# Patient Record
Sex: Male | Born: 1970
Health system: Southern US, Community
[De-identification: ages and names within clinical notes are randomized; demographics above are authoritative.]

## PROBLEM LIST (undated history)

## (undated) DIAGNOSIS — I10 Essential (primary) hypertension: Secondary | ICD-10-CM

## (undated) HISTORY — PX: VASECTOMY: SHX75

## (undated) HISTORY — PX: HERNIA REPAIR: SHX51

---

## 2012-09-19 ENCOUNTER — Encounter: Payer: Self-pay | Admitting: *Deleted

## 2012-09-19 ENCOUNTER — Emergency Department (INDEPENDENT_AMBULATORY_CARE_PROVIDER_SITE_OTHER)
Admission: EM | Admit: 2012-09-19 | Discharge: 2012-09-19 | Disposition: A | Discharge status: Home / Self Care | Payer: BC Managed Care – PPO | Source: Home / Self Care | Attending: Emergency Medicine | Admitting: Emergency Medicine

## 2012-09-19 ENCOUNTER — Emergency Department (INDEPENDENT_AMBULATORY_CARE_PROVIDER_SITE_OTHER): Payer: BC Managed Care – PPO

## 2012-09-19 DIAGNOSIS — X58XXXA Exposure to other specified factors, initial encounter: Secondary | ICD-10-CM

## 2012-09-19 DIAGNOSIS — S46911A Strain of unspecified muscle, fascia and tendon at shoulder and upper arm level, right arm, initial encounter: Secondary | ICD-10-CM

## 2012-09-19 DIAGNOSIS — M75101 Unspecified rotator cuff tear or rupture of right shoulder, not specified as traumatic: Secondary | ICD-10-CM

## 2012-09-19 DIAGNOSIS — S4980XA Other specified injuries of shoulder and upper arm, unspecified arm, initial encounter: Secondary | ICD-10-CM

## 2012-09-19 DIAGNOSIS — S43429A Sprain of unspecified rotator cuff capsule, initial encounter: Secondary | ICD-10-CM

## 2012-09-19 DIAGNOSIS — I10 Essential (primary) hypertension: Secondary | ICD-10-CM

## 2012-09-19 DIAGNOSIS — S46011A Strain of muscle(s) and tendon(s) of the rotator cuff of right shoulder, initial encounter: Secondary | ICD-10-CM

## 2012-09-19 DIAGNOSIS — M25519 Pain in unspecified shoulder: Secondary | ICD-10-CM

## 2012-09-19 HISTORY — DX: Essential (primary) hypertension: I10

## 2012-09-19 MED ORDER — RAMIPRIL 10 MG PO CAPS: 10.0000 mg | ORAL_CAPSULE | Freq: Every day | ORAL | Status: DC

## 2012-09-19 MED ORDER — HYDROCODONE-ACETAMINOPHEN 5-500 MG PO TABS: ORAL_TABLET | ORAL | Status: DC

## 2012-09-19 NOTE — ED Provider Notes (Signed)
History     CSN: [id number]  Arrival date & time 09/19/12  1519   First MD Initiated Contact with Patient 09/19/12 1531      Chief Complaint  Patient presents with  . Shoulder Injury    right     Patient is a 42 y.o. male presenting with shoulder injury. The history is provided by the patient.  Shoulder Injury This is a new problem. Episode onset: 5 days ago, on 09/14/12. The problem occurs constantly. The problem has been gradually worsening. Pertinent negatives include no chest pain, no abdominal pain, no headaches and no shortness of breath. Exacerbated by: Moving right arm and shoulder. The symptoms are relieved by rest. Treatments tried: Ibuprofen. The treatment provided mild relief.   5 days ago, he was drilling through a steel column, holding a large drill with right hand and applying the drill well holding the column with his left arm and hand.-He immediately developed dull pain in the right shoulder, and that has progressed daily to sharp, severe pain right shoulder especially posterior right shoulder, exacerbated by all movement of the shoulder. Denies radiation or paresthesias or focal weakness. No chest pain or dyspnea. He feels he has limited range of motion.  He states he was diagnosed with hypertension years ago, treated with ramipril which controlled his BP. He states his PCP retired in the past and patient has not taken the route and are probably for over 1 year. He's not had his BP checked since. He denies chest pain or dyspnea or any focal neurologic symptoms. He would like to establish with a new PCP, and I gave him names and numbers of the PCPs and he'll schedule an appointment within the next week for followup of his BP.  Past Medical History  Diagnosis Date  . Hypertension     untreated    Past Surgical History  Procedure Date  . Vasectomy     History reviewed. No pertinent family history.  History  Substance Use Topics  . Smoking status: Never Smoker   .  Smokeless tobacco: Not on file  . Alcohol Use: No      Review of Systems  HENT: Negative.   Respiratory: Negative for cough and shortness of breath.   Cardiovascular: Negative for chest pain, palpitations and leg swelling.  Gastrointestinal: Negative.  Negative for abdominal pain.  Genitourinary: Negative.   Neurological: Negative.  Negative for numbness and headaches.  All other systems reviewed and are negative.    Allergies  Review of patient's allergies indicates no known allergies.  Home Medications   Current Outpatient Rx  Name  Route  Sig  Dispense  Refill  . HYDROCODONE-ACETAMINOPHEN 5-500 MG PO TABS      Take 1 or 2 every 4-6 hours as needed for severe pain   15 tablet   0   . RAMIPRIL 10 MG PO CAPS   Oral   Take 1 capsule (10 mg total) by mouth daily. For BP. Take Every day.   30 capsule   0     BP 196/137  Pulse 64  Resp 14  Ht 5' 10.5" (1.791 m)  Wt 267 lb (121.11 kg)  BMI 37.77 kg/m2  SpO2 97%  Physical Exam  Nursing note and vitals reviewed. Constitutional: He is oriented to person, place, and time. He appears well-developed and well-nourished. No distress.       He appears very uncomfortable from right shoulder pain, splinting his right shoulder. No acute cardiorespiratory distress  HENT:  Head: Normocephalic and atraumatic.  Eyes: Conjunctivae normal and EOM are normal. Pupils are equal, round, and reactive to light. No scleral icterus.  Neck: Normal range of motion. Neck supple. No JVD present. No spinous process tenderness present.  Cardiovascular: Normal rate, regular rhythm, S1 normal and S2 normal.  Exam reveals no gallop.   No murmur heard. Pulmonary/Chest: Effort normal and breath sounds normal.  Abdominal: He exhibits no distension. There is no tenderness.  Musculoskeletal:       Right shoulder: He exhibits decreased range of motion (Markedly decreased range of motion, passive abduction to 90. With pain. Active range of motion  markedly limited to 45 with pain), tenderness (Exquisitely tender to palpation over the rotator cuff area of posterior shoulder.) and bony tenderness. He exhibits no swelling, no laceration, no spasm and normal pulse.       Left shoulder: Normal.       Right elbow: Normal.      Right wrist: Normal.       R shoulder :The rotator cuff, empty can impingement sign is markedly positive.  Neurological: He is alert and oriented to person, place, and time. No sensory deficit.  Skin: Skin is warm.  Psychiatric: He has a normal mood and affect.   lower extremities without cyanosis clubbing or edema.  ED Course  Procedures (including critical care time)  Labs Reviewed - No data to display Dg Shoulder Right  09/19/2012  *RADIOLOGY REPORT*  Clinical Data: Right shoulder pain after working.  Difficulty elevating right arm.  RIGHT SHOULDER - 2+ VIEW  Comparison:  None.  Findings:  There is no evidence of fracture or dislocation.  There is no evidence of arthropathy or other focal bone abnormality. Soft tissues are unremarkable.  IMPRESSION: Negative.   Original Report Authenticated By: Davonna Belling, M.D.      1. Strain of tendon of right rotator cuff   2. Muscle strain of right shoulder   3. Rotator cuff syndrome of right shoulder   4. Hypertension       MDM  We discussed the above diagnoses. He likely has a strain with partial tear of right rotator cuff. I also rechecked BP at 174/112 left arm with large cuff, which is an improvement from when he first came in to urgent care. For the right shoulder strain/rotator cuff syndrome, right shoulder sling applied. Vicodin or Tylenol when necessary pain. Avoid NSAIDs to avoid elevated BP from the NSAIDs. Prescribed ramipril will which he took before and controlled his BP over a year ago. His PCP can reevaluate management of hypertension. See detailed Instructions in AVS, which were given to patient. Verbal instructions also given. Risks, benefits, and  alternatives of treatment options discussed. Questions invited and answered. Patient voiced understanding and agreement with plans.   Addendum: I facilitated making an appointment for patient to followup with Dr. Benjamin Stain on Thursday 09/21/12 at 2 PM for followup of the right shoulder problem and hypertension. Patient voiced understanding and agreement        Lajean Manes, MD 09/19/12 [phone]

## 2012-09-19 NOTE — ED Notes (Signed)
PAtient c/o right shoulder pain x 5 days. Unsure of cause. Limited ROM

## 2012-09-21 ENCOUNTER — Ambulatory Visit: Payer: BC Managed Care – PPO | Admitting: Sports Medicine

## 2012-09-27 ENCOUNTER — Ambulatory Visit (INDEPENDENT_AMBULATORY_CARE_PROVIDER_SITE_OTHER): Payer: BC Managed Care – PPO | Admitting: Sports Medicine

## 2012-09-27 ENCOUNTER — Encounter: Payer: Self-pay | Admitting: Sports Medicine

## 2012-09-27 VITALS — BP 169/116 | HR 78 | Wt 268.0 lb

## 2012-09-27 DIAGNOSIS — H6120 Impacted cerumen, unspecified ear: Secondary | ICD-10-CM

## 2012-09-27 DIAGNOSIS — I10 Essential (primary) hypertension: Secondary | ICD-10-CM | POA: Insufficient documentation

## 2012-09-27 DIAGNOSIS — Z299 Encounter for prophylactic measures, unspecified: Secondary | ICD-10-CM

## 2012-09-27 DIAGNOSIS — M25519 Pain in unspecified shoulder: Secondary | ICD-10-CM

## 2012-09-27 DIAGNOSIS — Z0000 Encounter for general adult medical examination without abnormal findings: Secondary | ICD-10-CM | POA: Insufficient documentation

## 2012-09-27 DIAGNOSIS — M25511 Pain in right shoulder: Secondary | ICD-10-CM | POA: Insufficient documentation

## 2012-09-27 MED ORDER — MELOXICAM 15 MG PO TABS: ORAL_TABLET | ORAL | Status: DC

## 2012-09-27 MED ORDER — LISINOPRIL-HYDROCHLOROTHIAZIDE 20-25 MG PO TABS: 1.0000 | ORAL_TABLET | Freq: Every day | ORAL | Status: DC

## 2012-09-27 NOTE — Assessment & Plan Note (Signed)
Uncontrolled with current medication. He also has some lower extremity edema. I'm going to switch this to lisinopril/hydrochlorothiazide. He will come back in one week to recheck blood pressure and augment medication.

## 2012-09-27 NOTE — Assessment & Plan Note (Addendum)
Removed by me with curette.

## 2012-09-27 NOTE — Progress Notes (Signed)
   Subjective:    I'm seeing the patient as a consultation for Dr. Georgina Pillion.  CC: Establish care, consult for right shoulder, hypertension.   HPI:  Hypertension: Was severely elevated in the urgent care Center, was started on Prempro, is improved, but not sufficiently. He denies any chest pain, headaches, shortness of breath, visual changes.  Right shoulder pain: Worse over the deltoid, worse with overhead activities, to the Vicodin, and has really rested the shoulder more than anything else it's improved significantly. Pain is particularly bad with overhead activities, does not wake him from sleep, does not radiate down to the fingertips and he denies any neck pain or numbness or tingling.  Right ear: Notes a fluttering sound when he wakes up in the morning, denies reflux symptoms. Hearing is okay.  Past medical history, Surgical history, Family history not pertinant except as noted below, Social history, Allergies, and medications have been entered into the medical record, reviewed, and no changes needed.   Review of Systems: No headache, visual changes, nausea, vomiting, diarrhea, constipation, dizziness, abdominal pain, skin rash, fevers, chills, night sweats, swollen lymph nodes, weight loss, chest pain, body aches, joint swelling, muscle aches, shortness of breath, mood changes, visual or auditory hallucinations.  Objective:    General: Well Developed, well nourished, and in no acute distress.  Neuro: Alert and oriented x3, extra-ocular muscles intact, sensation grossly intact.  HEENT: Normocephalic, atraumatic, pupils equal round reactive to light, neck supple, no masses, no lymphadenopathy, thyroid nonpalpable. Cerumen was seen to be impacted in the right external canal. Attempted flushing was effective, I remove the impacted wax myself with a curette. Skin: Warm and dry, no rashes noted.  Cardiac: Regular rate and rhythm, no murmurs rubs or gallops. 1+ pitting edema in both lower  extremities. Respiratory: Clear to auscultation bilaterally. Not using accessory muscles, speaking in full sentences.  Abdominal: Soft, nontender, nondistended, positive bowel sounds, no masses, no organomegaly.  Right Shoulder: Inspection reveals no abnormalities, atrophy or asymmetry. Palpation is normal with no tenderness over AC joint or bicipital groove. ROM is full in all planes. Rotator cuff strength normal throughout. Mildly positive Neer's, Hawkin's, and Empty Can signs. Speeds and Yergason's tests normal. No labral pathology noted with negative Obrien's, negative clunk and good stability. Normal scapular function observed. No painful arc and no drop arm sign. No apprehension sign Impression and Recommendations:   The patient was counselled, risk factors were discussed, anticipatory guidance given.

## 2012-09-27 NOTE — Assessment & Plan Note (Signed)
Checking some blood work. Tdap is up to date April 2013.

## 2012-09-27 NOTE — Assessment & Plan Note (Signed)
Symptoms are highly suggestive of rotator cuff dysfunction. Conservative measures have worked well and his pain is nearly gone. I'm going to add some home rehabilitation and cuff strengthening exercises, as well as Mobic.

## 2012-10-11 ENCOUNTER — Ambulatory Visit: Payer: BC Managed Care – PPO | Admitting: Sports Medicine

## 2012-10-11 DIAGNOSIS — Z0289 Encounter for other administrative examinations: Secondary | ICD-10-CM

## 2013-02-12 ENCOUNTER — Other Ambulatory Visit: Payer: Self-pay | Admitting: Sports Medicine

## 2013-03-27 ENCOUNTER — Other Ambulatory Visit: Payer: Self-pay | Admitting: Sports Medicine

## 2013-08-02 ENCOUNTER — Other Ambulatory Visit: Payer: Self-pay | Admitting: *Deleted

## 2013-08-02 MED ORDER — LISINOPRIL-HYDROCHLOROTHIAZIDE 20-25 MG PO TABS: ORAL_TABLET | ORAL | Status: DC

## 2013-08-06 ENCOUNTER — Ambulatory Visit (INDEPENDENT_AMBULATORY_CARE_PROVIDER_SITE_OTHER): Payer: BC Managed Care – PPO | Admitting: Sports Medicine

## 2013-08-06 ENCOUNTER — Encounter: Payer: Self-pay | Admitting: Sports Medicine

## 2013-08-06 VITALS — BP 161/106 | HR 74 | Wt 261.0 lb

## 2013-08-06 DIAGNOSIS — M25519 Pain in unspecified shoulder: Secondary | ICD-10-CM

## 2013-08-06 DIAGNOSIS — M25511 Pain in right shoulder: Secondary | ICD-10-CM

## 2013-08-06 DIAGNOSIS — I10 Essential (primary) hypertension: Secondary | ICD-10-CM

## 2013-08-06 DIAGNOSIS — E785 Hyperlipidemia, unspecified: Secondary | ICD-10-CM | POA: Insufficient documentation

## 2013-08-06 MED ORDER — AMLODIPINE BESYLATE 10 MG PO TABS: 10.0000 mg | ORAL_TABLET | Freq: Every day | ORAL | Status: DC

## 2013-08-06 MED ORDER — ATORVASTATIN CALCIUM 40 MG PO TABS: 40.0000 mg | ORAL_TABLET | Freq: Every day | ORAL | Status: DC

## 2013-08-06 NOTE — Assessment & Plan Note (Signed)
Still elevated her lisinopril/hydrochlorothiazide. Adding amlodipine. Return in 2 weeks to recheck.

## 2013-08-06 NOTE — Assessment & Plan Note (Signed)
Total cholesterol near 300 and LDL of 180 we do need to start high dose statin. We can recheck this in about 2 months.

## 2013-08-06 NOTE — Assessment & Plan Note (Signed)
Resolved with conservative measures. 

## 2013-08-06 NOTE — Progress Notes (Signed)
  Subjective:    CC: Followup  HPI: Hypertension: Continue to be elevated on current medications. No headaches, chest pain, visual changes.  Hyperlipidemia: Routine work screening showed extremely high lipids.  Shoulder pain: Resolved.  Past medical history, Surgical history, Family history not pertinant except as noted below, Social history, Allergies, and medications have been entered into the medical record, reviewed, and no changes needed.   Review of Systems: No fevers, chills, night sweats, weight loss, chest pain, or shortness of breath.   Objective:    General: Well Developed, well nourished, and in no acute distress.  Neuro: Alert and oriented x3, extra-ocular muscles intact, sensation grossly intact.  HEENT: Normocephalic, atraumatic, pupils equal round reactive to light, neck supple, no masses, no lymphadenopathy, thyroid nonpalpable.  Skin: Warm and dry, no rashes. Cardiac: Regular rate and rhythm, no murmurs rubs or gallops, no lower extremity edema.  Respiratory: Clear to auscultation bilaterally. Not using accessory muscles, speaking in full sentences.  Impression and Recommendations:

## 2013-08-24 ENCOUNTER — Encounter: Payer: Self-pay | Admitting: Sports Medicine

## 2013-08-24 ENCOUNTER — Ambulatory Visit (INDEPENDENT_AMBULATORY_CARE_PROVIDER_SITE_OTHER): Payer: BC Managed Care – PPO | Admitting: Sports Medicine

## 2013-08-24 VITALS — BP 131/84 | HR 84 | Wt 261.0 lb

## 2013-08-24 DIAGNOSIS — I10 Essential (primary) hypertension: Secondary | ICD-10-CM

## 2013-08-24 DIAGNOSIS — M25519 Pain in unspecified shoulder: Secondary | ICD-10-CM

## 2013-08-24 DIAGNOSIS — E785 Hyperlipidemia, unspecified: Secondary | ICD-10-CM

## 2013-08-24 DIAGNOSIS — M25511 Pain in right shoulder: Secondary | ICD-10-CM

## 2013-08-24 NOTE — Progress Notes (Signed)
  Subjective:    CC: Followup  HPI: Hypertension: Well controlled.  Hyperlipidemia: Stable Lipitor, not yet ready to recheck.  Left-sided neck pain: Recurred while drying his head, he had a pinch of pain over the left trapezius without radiation. Mild, persistent, improving significantly.  Past medical history, Surgical history, Family history not pertinant except as noted below, Social history, Allergies, and medications have been entered into the medical record, reviewed, and no changes needed.   Review of Systems: No fevers, chills, night sweats, weight loss, chest pain, or shortness of breath.   Objective:    General: Well Developed, well nourished, and in no acute distress.  Neuro: Alert and oriented x3, extra-ocular muscles intact, sensation grossly intact.  HEENT: Normocephalic, atraumatic, pupils equal round reactive to light, neck supple, no masses, no lymphadenopathy, thyroid nonpalpable.  Skin: Warm and dry, no rashes. Cardiac: Regular rate and rhythm, no murmurs rubs or gallops, no lower extremity edema.  Respiratory: Clear to auscultation bilaterally. Not using accessory muscles, speaking in full sentences. Neck: Inspection unremarkable. No palpable stepoffs. Negative Spurling's maneuver. Full neck range of motion Grip strength and sensation normal in bilateral hands Strength good C4 to T1 distribution No sensory change to C4 to T1 Negative Hoffman sign bilaterally Reflexes normal  Impression and Recommendations:

## 2013-08-24 NOTE — Assessment & Plan Note (Signed)
Stable on Lipitor. He does need to recheck lipids in about 2-1/2 months, orders placed.

## 2013-08-24 NOTE — Assessment & Plan Note (Signed)
Resolved, return as needed for this. He did have a recent mild trapezial strain, I have given some home exercises for this.

## 2013-08-24 NOTE — Assessment & Plan Note (Signed)
Controlled. No changes. 

## 2013-09-12 ENCOUNTER — Ambulatory Visit (INDEPENDENT_AMBULATORY_CARE_PROVIDER_SITE_OTHER): Payer: BC Managed Care – PPO | Admitting: Sports Medicine

## 2013-09-12 ENCOUNTER — Encounter: Payer: Self-pay | Admitting: Sports Medicine

## 2013-09-12 VITALS — BP 129/89 | HR 61 | Ht 71.0 in | Wt 260.0 lb

## 2013-09-12 DIAGNOSIS — I10 Essential (primary) hypertension: Secondary | ICD-10-CM

## 2013-09-12 DIAGNOSIS — G43109 Migraine with aura, not intractable, without status migrainosus: Secondary | ICD-10-CM

## 2013-09-12 MED ORDER — SUMATRIPTAN SUCCINATE 50 MG PO TABS: 50.0000 mg | ORAL_TABLET | ORAL | Status: DC | PRN

## 2013-09-12 MED ORDER — PROMETHAZINE HCL 25 MG PO TABS: 25.0000 mg | ORAL_TABLET | Freq: Four times a day (QID) | ORAL | Status: DC | PRN

## 2013-09-12 MED ORDER — NAPROXEN 500 MG PO TABS: 500.0000 mg | ORAL_TABLET | Freq: Two times a day (BID) | ORAL | Status: DC

## 2013-09-12 MED ORDER — DEXAMETHASONE 4 MG PO TABS: 4.0000 mg | ORAL_TABLET | Freq: Two times a day (BID) | ORAL | Status: DC

## 2013-09-12 NOTE — Assessment & Plan Note (Addendum)
Jeremiah LericheMark describes a classic migraine with aura. It is currently resolved. Adding naproxen, Imitrex, Decadron, Phenergan for use as needed. Return as needed.

## 2013-09-12 NOTE — Progress Notes (Signed)
  Subjective:    CC: Headaches  HPI: This pleasant 43 year old male with no history of migraines comes in with an episode where he developed scotomata in both visual fields in a homonymous fashion, approximately 30 minutes later he developed adult headache that was throbbing and localized bitemporally. He denied any numbness or tingling in either of the extremities or weakness. Headache lasted for a few hours and he denied photophobia and phonophobia, he did have some nausea. Pain was moderate, persistent and has now resolved.  Past medical history, Surgical history, Family history not pertinant except as noted below, Social history, Allergies, and medications have been entered into the medical record, reviewed, and no changes needed.   Review of Systems: No fevers, chills, night sweats, weight loss, chest pain, or shortness of breath.   Objective:    General: Well Developed, well nourished, and in no acute distress.  Neuro: Alert and oriented x3, extra-ocular muscles intact, sensation grossly intact. Cranial nerves II through XII are intact, motor, sensory, and coordinative functions are intact. HEENT: Normocephalic, atraumatic, pupils equal round reactive to light, neck supple, no masses, no lymphadenopathy, thyroid nonpalpable.  Skin: Warm and dry, no rashes. Cardiac: Regular rate and rhythm, no murmurs rubs or gallops, no lower extremity edema.  Respiratory: Clear to auscultation bilaterally. Not using accessory muscles, speaking in full sentences.  Impression and Recommendations:

## 2013-09-12 NOTE — Patient Instructions (Signed)
Migraine Headache A migraine headache is an intense, throbbing pain on one or both sides of your head. A migraine can last for 30 minutes to several hours. CAUSES  The exact cause of a migraine headache is not always known. However, a migraine may be caused when nerves in the brain become irritated and release chemicals that cause inflammation. This causes pain. Certain things may also trigger migraines, such as:  Alcohol.  Smoking.  Stress.  Menstruation.  Aged cheeses.  Foods or drinks that contain nitrates, glutamate, aspartame, or tyramine.  Lack of sleep.  Chocolate.  Caffeine.  Hunger.  Physical exertion.  Fatigue.  Medicines used to treat chest pain (nitroglycerine), birth control pills, estrogen, and some blood pressure medicines. SIGNS AND SYMPTOMS  Pain on one or both sides of your head.  Pulsating or throbbing pain.  Severe pain that prevents daily activities.  Pain that is aggravated by any physical activity.  Nausea, vomiting, or both.  Dizziness.  Pain with exposure to bright lights, loud noises, or activity.  General sensitivity to bright lights, loud noises, or smells. Before you get a migraine, you may get warning signs that a migraine is coming (aura). An aura may include:  Seeing flashing lights.  Seeing bright spots, halos, or zig-zag lines.  Having tunnel vision or blurred vision.  Having feelings of numbness or tingling.  Having trouble talking.  Having muscle weakness. DIAGNOSIS  A migraine headache is often diagnosed based on:  Symptoms.  Physical exam.  A CT scan or MRI of your head. These imaging tests cannot diagnose migraines, but they can help rule out other causes of headaches. TREATMENT Medicines may be given for pain and nausea. Medicines can also be given to help prevent recurrent migraines.  HOME CARE INSTRUCTIONS  Only take over-the-counter or prescription medicines for pain or discomfort as directed by your  health care provider. The use of long-term narcotics is not recommended.  Lie down in a dark, quiet room when you have a migraine.  Keep a journal to find out what may trigger your migraine headaches. For example, write down:  What you eat and drink.  How much sleep you get.  Any change to your diet or medicines.  Limit alcohol consumption.  Quit smoking if you smoke.  Get 7 9 hours of sleep, or as recommended by your health care provider.  Limit stress.  Keep lights dim if bright lights bother you and make your migraines worse. SEEK IMMEDIATE MEDICAL CARE IF:   Your migraine becomes severe.  You have a fever.  You have a stiff neck.  You have vision loss.  You have muscular weakness or loss of muscle control.  You start losing your balance or have trouble walking.  You feel faint or pass out.  You have severe symptoms that are different from your first symptoms. MAKE SURE YOU:   Understand these instructions.  Will watch your condition.  Will get help right away if you are not doing well or get worse. Document Released: 08/09/2005 Document Revised: 05/30/2013 Document Reviewed: 04/16/2013 [employer] Patient Information 2014 [employer].  

## 2013-09-12 NOTE — Assessment & Plan Note (Signed)
Well controlled 

## 2013-09-14 ENCOUNTER — Other Ambulatory Visit: Payer: Self-pay | Admitting: Sports Medicine

## 2014-01-01 ENCOUNTER — Other Ambulatory Visit: Payer: Self-pay | Admitting: Sports Medicine

## 2014-01-10 ENCOUNTER — Other Ambulatory Visit: Payer: Self-pay | Admitting: Sports Medicine

## 2014-01-10 LAB — CBC
HCT: 41.3 % (ref 39.0–52.0)
Hemoglobin: 14.1 g/dL (ref 13.0–17.0)
MCH: 29.1 pg (ref 26.0–34.0)
MCHC: 34.1 g/dL (ref 30.0–36.0)
MCV: 85.2 fL (ref 78.0–100.0)
Platelets: 302 10*3/uL (ref 150–400)
RBC: 4.85 MIL/uL (ref 4.22–5.81)
RDW: 13.7 % (ref 11.5–15.5)
WBC: 6.3 K/uL (ref 4.0–10.5)

## 2014-01-10 LAB — COMPREHENSIVE METABOLIC PANEL
ALT: 21 U/L (ref 0–53)
AST: 22 U/L (ref 0–37)
Albumin: 4.7 g/dL (ref 3.5–5.2)
Alkaline Phosphatase: 60 U/L (ref 39–117)
Glucose, Bld: 97 mg/dL (ref 70–99)
Potassium: 4.2 mEq/L (ref 3.5–5.3)
Sodium: 139 mEq/L (ref 135–145)
Total Bilirubin: 0.9 mg/dL (ref 0.2–1.2)
Total Protein: 7.5 g/dL (ref 6.0–8.3)

## 2014-01-10 LAB — LIPID PANEL
Cholesterol: 156 mg/dL (ref 0–200)
HDL: 34 mg/dL — ABNORMAL LOW (ref >39)
LDL Cholesterol: 84 mg/dL (ref 0–99)
Total CHOL/HDL Ratio: 4.6 ratio
Triglycerides: 192 mg/dL — ABNORMAL HIGH (ref <150)
VLDL: 38 mg/dL (ref 0–40)

## 2014-01-10 LAB — COMPREHENSIVE METABOLIC PANEL WITH GFR
BUN: 16 mg/dL (ref 6–23)
CO2: 27 meq/L (ref 19–32)
Calcium: 10.1 mg/dL (ref 8.4–10.5)
Chloride: 101 meq/L (ref 96–112)
Creat: 1.06 mg/dL (ref 0.50–1.35)

## 2014-01-10 LAB — HEMOGLOBIN A1C
Hgb A1c MFr Bld: 5.9 % — ABNORMAL HIGH (ref <5.7)
Mean Plasma Glucose: 123 mg/dL — ABNORMAL HIGH (ref <117)

## 2014-01-10 LAB — TSH: TSH: 1.591 u[IU]/mL (ref 0.350–4.500)

## 2014-01-11 ENCOUNTER — Encounter: Payer: Self-pay | Admitting: Sports Medicine

## 2014-01-11 DIAGNOSIS — E291 Testicular hypofunction: Secondary | ICD-10-CM | POA: Insufficient documentation

## 2014-01-11 LAB — VITAMIN D 25 HYDROXY (VIT D DEFICIENCY, FRACTURES): Vit D, 25-Hydroxy: 44 ng/mL (ref 30–89)

## 2014-01-11 LAB — TESTOSTERONE, FREE, TOTAL, SHBG
Sex Hormone Binding: 16 nmol/L (ref 13–71)
Testosterone, Free: 40.5 pg/mL — ABNORMAL LOW (ref 47.0–244.0)
Testosterone-% Free: 2.7 % (ref 1.6–2.9)
Testosterone: 152 ng/dL — ABNORMAL LOW (ref 300–890)

## 2014-01-11 IMAGING — CR DG SHOULDER 2+V*R*
3 series · 3 of 3 positions shown · non-contrast
Comparison: None.

CLINICAL DATA: Right shoulder pain after working.  Difficulty
elevating right arm.

RIGHT SHOULDER - 2+ VIEW

[view not recorded (1 of 3)]
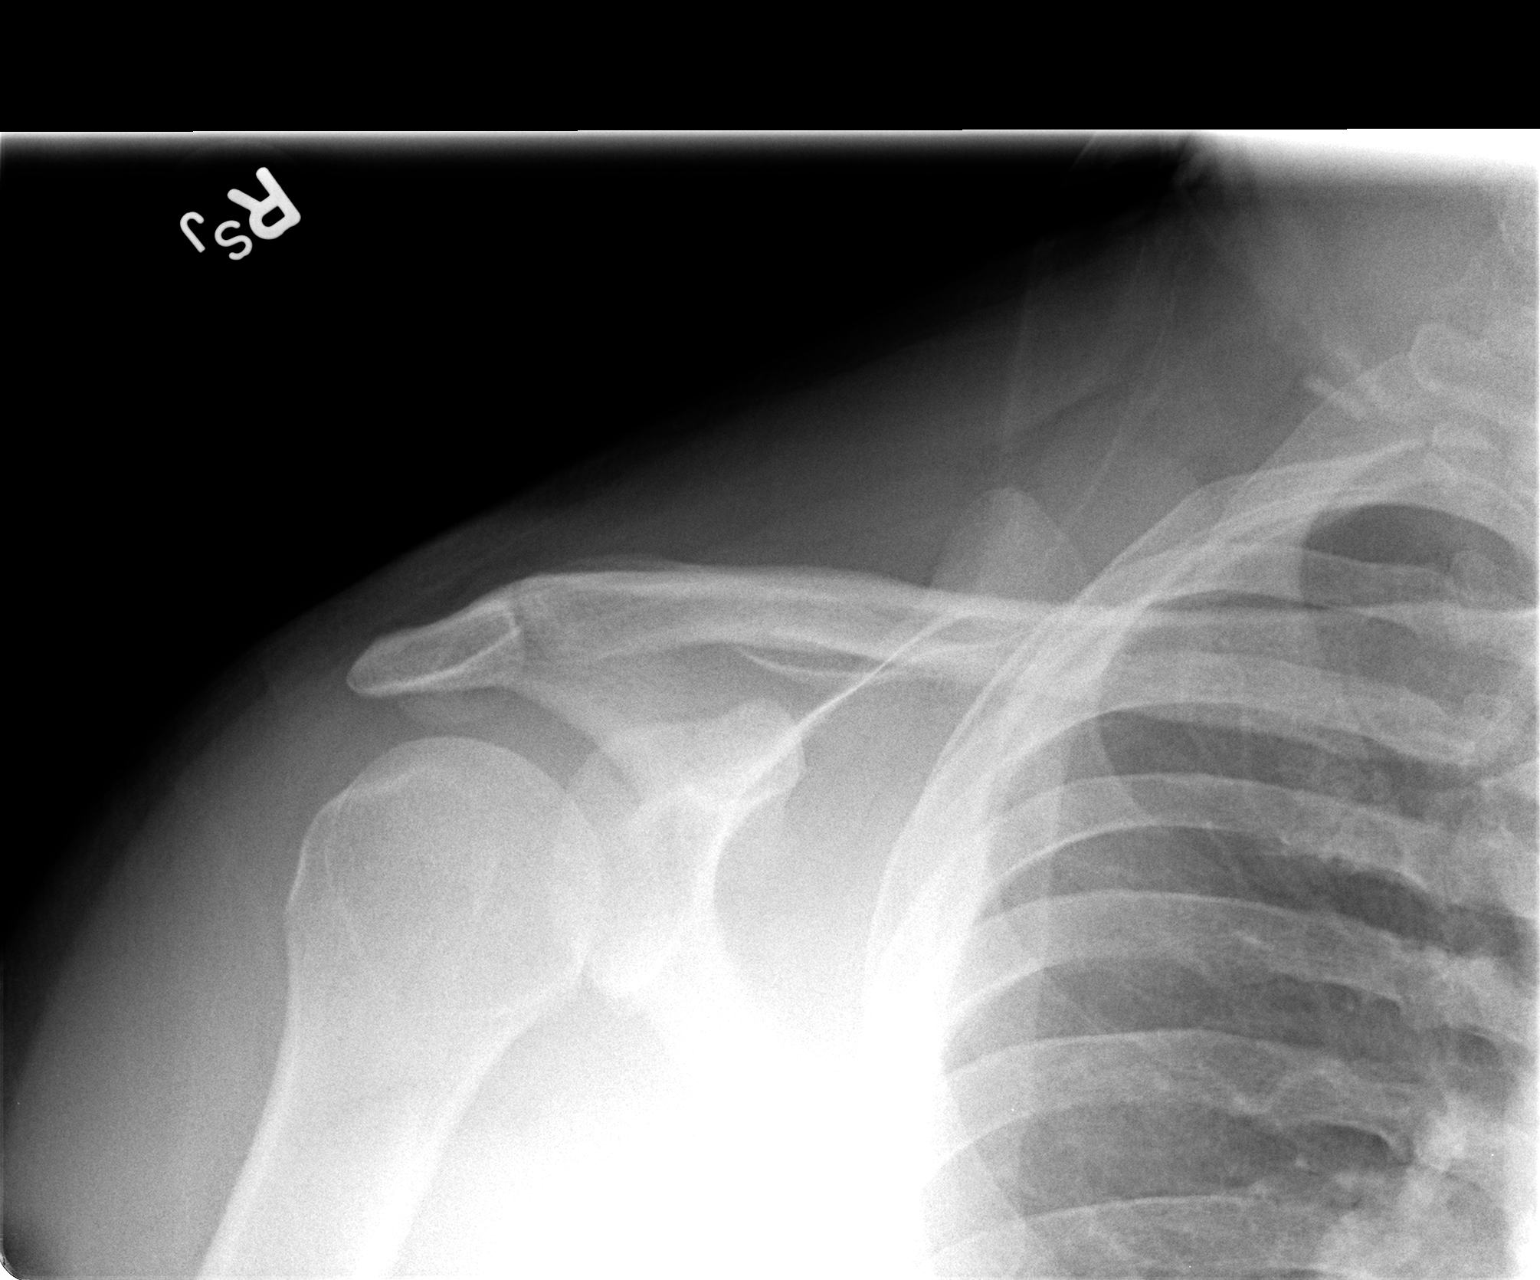

[view not recorded (2 of 3)]
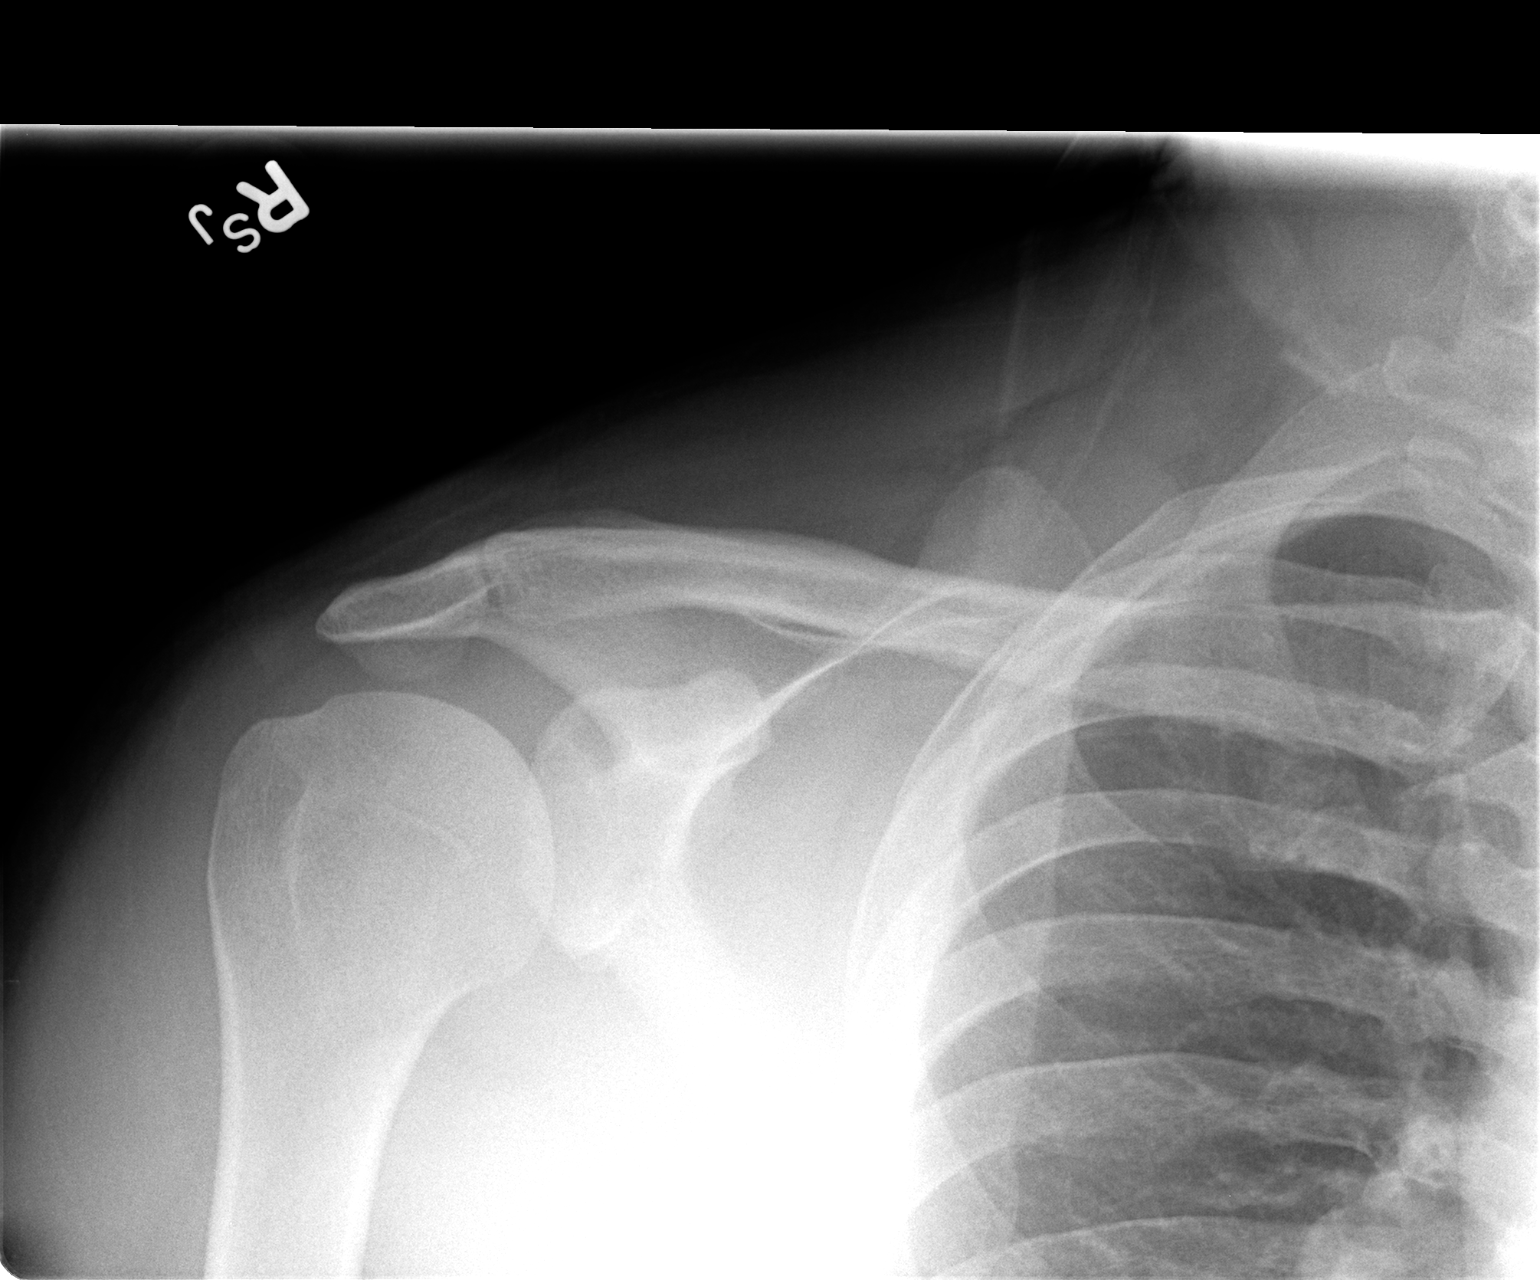

[view not recorded (3 of 3)]
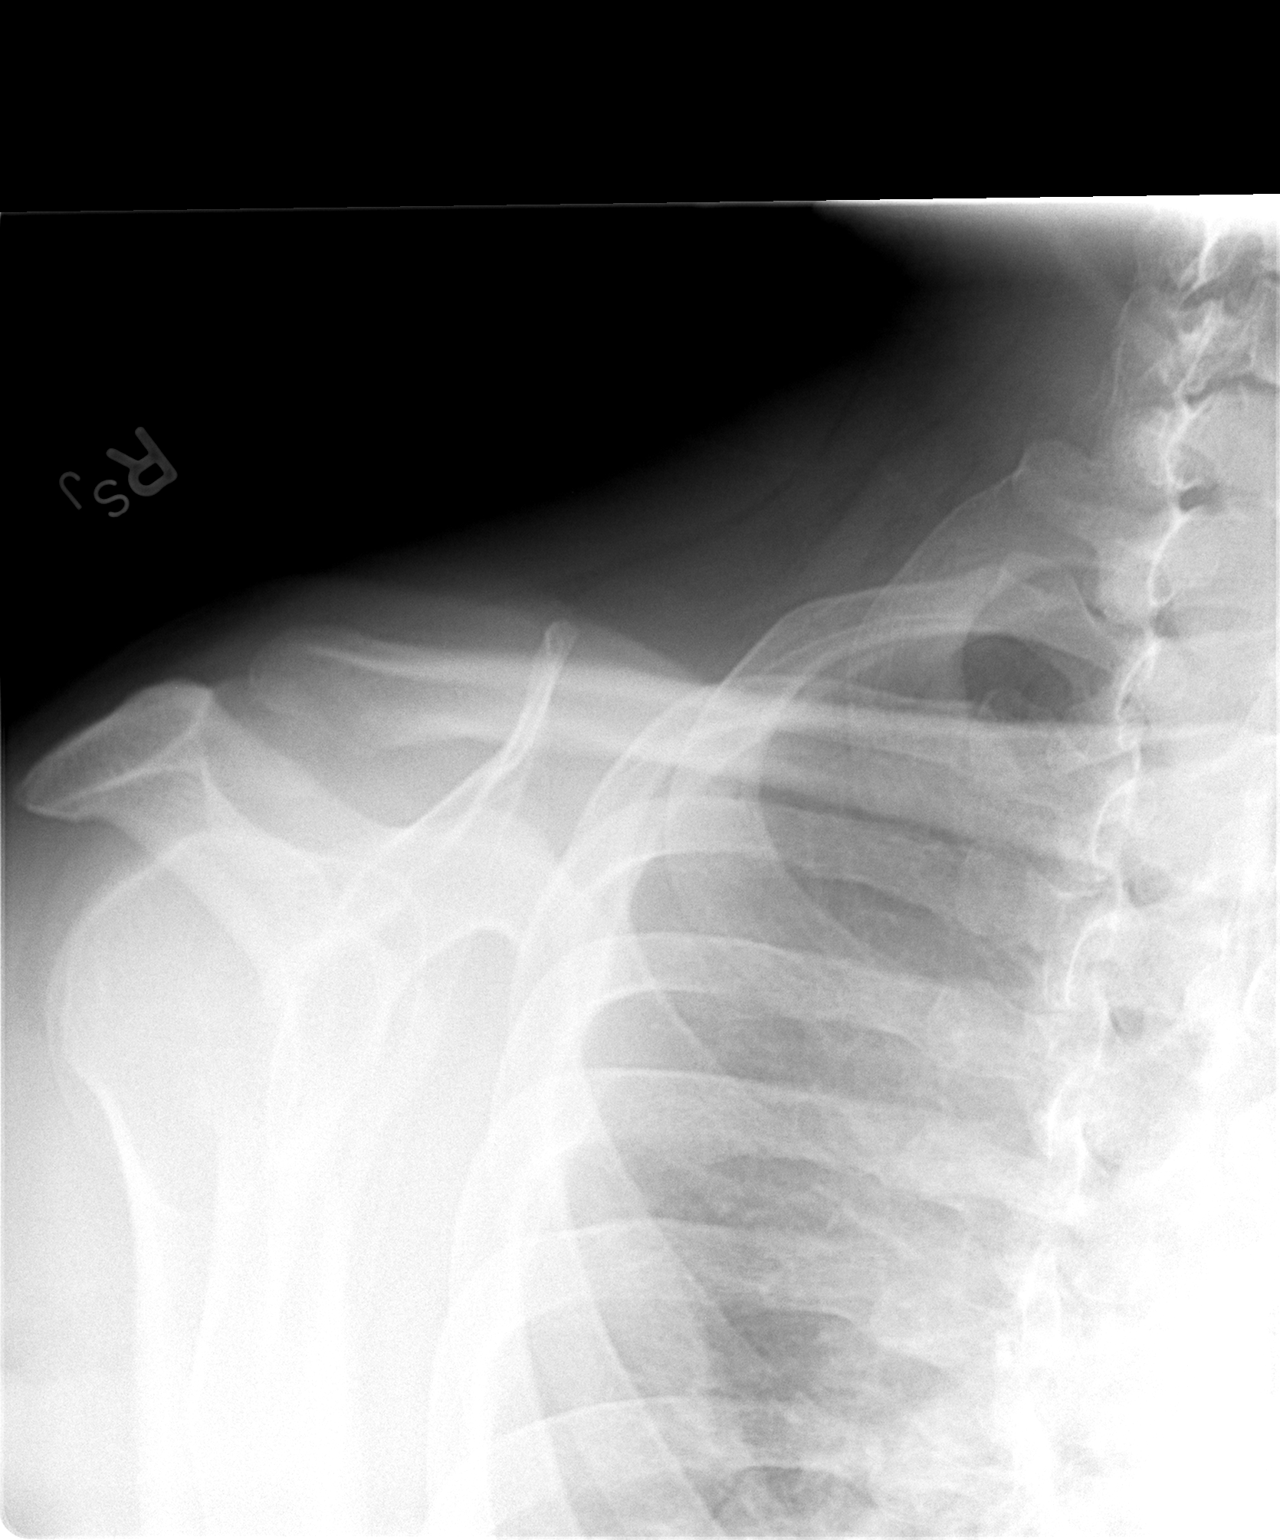

[3 of 3 positions shown; findings below may reference images not displayed]

FINDINGS: There is no evidence of fracture or dislocation.  There
is no evidence of arthropathy or other focal bone abnormality.
Soft tissues are unremarkable.
IMPRESSION: Negative.

## 2014-02-15 ENCOUNTER — Other Ambulatory Visit: Payer: Self-pay | Admitting: Sports Medicine

## 2014-02-15 DIAGNOSIS — I10 Essential (primary) hypertension: Secondary | ICD-10-CM

## 2014-02-15 MED ORDER — AMLODIPINE BESYLATE 10 MG PO TABS: 10.0000 mg | ORAL_TABLET | Freq: Every day | ORAL | Status: DC

## 2014-02-21 ENCOUNTER — Ambulatory Visit: Payer: BC Managed Care – PPO | Admitting: Sports Medicine

## 2014-03-01 ENCOUNTER — Ambulatory Visit: Payer: BC Managed Care – PPO | Admitting: Sports Medicine

## 2014-08-08 ENCOUNTER — Other Ambulatory Visit: Payer: Self-pay | Admitting: Sports Medicine

## 2014-12-17 ENCOUNTER — Other Ambulatory Visit: Payer: Self-pay | Admitting: Sports Medicine

## 2015-01-14 ENCOUNTER — Other Ambulatory Visit: Payer: Self-pay | Admitting: Sports Medicine

## 2015-01-21 ENCOUNTER — Encounter: Payer: Self-pay | Admitting: Sports Medicine

## 2015-01-21 ENCOUNTER — Ambulatory Visit (INDEPENDENT_AMBULATORY_CARE_PROVIDER_SITE_OTHER): Payer: Self-pay | Admitting: Sports Medicine

## 2015-01-21 VITALS — BP 122/77 | HR 85 | Ht 70.5 in | Wt 226.0 lb

## 2015-01-21 DIAGNOSIS — Z299 Encounter for prophylactic measures, unspecified: Secondary | ICD-10-CM

## 2015-01-21 DIAGNOSIS — I10 Essential (primary) hypertension: Secondary | ICD-10-CM

## 2015-01-21 DIAGNOSIS — Z418 Encounter for other procedures for purposes other than remedying health state: Secondary | ICD-10-CM

## 2015-01-21 DIAGNOSIS — E785 Hyperlipidemia, unspecified: Secondary | ICD-10-CM

## 2015-01-21 DIAGNOSIS — G43109 Migraine with aura, not intractable, without status migrainosus: Secondary | ICD-10-CM

## 2015-01-21 NOTE — Assessment & Plan Note (Signed)
Controlled.  

## 2015-01-21 NOTE — Assessment & Plan Note (Signed)
Well controlled, no changes 

## 2015-01-21 NOTE — Assessment & Plan Note (Signed)
Controlled, no changes, continue Lipitor. Checking blood work.

## 2015-01-21 NOTE — Progress Notes (Signed)
  Subjective:    CC: follow-up  HPI: Hypertension: Stable and well controlled.  Hyperlipidemia: Stable and well controlled.  Migraines: Stable and well controlled.  Preventative measures: Up-to-date on everything, Tdap 6 years ago.  Past medical history, Surgical history, Family history not pertinant except as noted below, Social history, Allergies, and medications have been entered into the medical record, reviewed, and no changes needed.   Review of Systems: No fevers, chills, night sweats, weight loss, chest pain, or shortness of breath.   Objective:    General: Well Developed, well nourished, and in no acute distress.  Neuro: Alert and oriented x3, extra-ocular muscles intact, sensation grossly intact.  HEENT: Normocephalic, atraumatic, pupils equal round reactive to light, neck supple, no masses, no lymphadenopathy, thyroid nonpalpable.  Skin: Warm and dry, no rashes. Cardiac: Regular rate and rhythm, no murmurs rubs or gallops, no lower extremity edema.  Respiratory: Clear to auscultation bilaterally. Not using accessory muscles, speaking in full sentences.  Impression and Recommendations:

## 2015-01-21 NOTE — Assessment & Plan Note (Signed)
Up-to-date on all screening measures.

## 2015-02-09 ENCOUNTER — Other Ambulatory Visit: Payer: Self-pay | Admitting: Sports Medicine

## 2015-03-09 ENCOUNTER — Other Ambulatory Visit: Payer: Self-pay | Admitting: Sports Medicine

## 2015-04-02 ENCOUNTER — Other Ambulatory Visit: Payer: Self-pay | Admitting: Sports Medicine

## 2015-05-03 ENCOUNTER — Other Ambulatory Visit: Payer: Self-pay | Admitting: Sports Medicine

## 2015-05-04 ENCOUNTER — Other Ambulatory Visit: Payer: Self-pay | Admitting: Sports Medicine

## 2015-06-02 ENCOUNTER — Other Ambulatory Visit: Payer: Self-pay | Admitting: Sports Medicine

## 2015-06-28 ENCOUNTER — Other Ambulatory Visit: Payer: Self-pay | Admitting: Sports Medicine

## 2015-07-27 ENCOUNTER — Other Ambulatory Visit: Payer: Self-pay | Admitting: Sports Medicine

## 2015-08-23 ENCOUNTER — Other Ambulatory Visit: Payer: Self-pay | Admitting: Sports Medicine

## 2015-09-21 ENCOUNTER — Other Ambulatory Visit: Payer: Self-pay | Admitting: Sports Medicine

## 2015-10-15 ENCOUNTER — Other Ambulatory Visit: Payer: Self-pay | Admitting: Sports Medicine

## 2015-11-10 ENCOUNTER — Other Ambulatory Visit: Payer: Self-pay | Admitting: Sports Medicine

## 2016-01-08 ENCOUNTER — Other Ambulatory Visit: Payer: Self-pay | Admitting: Sports Medicine

## 2016-03-02 ENCOUNTER — Other Ambulatory Visit: Payer: Self-pay | Admitting: Sports Medicine

## 2016-04-04 ENCOUNTER — Other Ambulatory Visit: Payer: Self-pay | Admitting: Sports Medicine

## 2016-08-20 ENCOUNTER — Other Ambulatory Visit: Payer: Self-pay | Admitting: Sports Medicine

## 2016-09-12 ENCOUNTER — Other Ambulatory Visit: Payer: Self-pay | Admitting: Sports Medicine

## 2016-10-08 ENCOUNTER — Other Ambulatory Visit: Payer: Self-pay | Admitting: Sports Medicine

## 2017-03-25 ENCOUNTER — Other Ambulatory Visit: Payer: Self-pay | Admitting: Sports Medicine

## 2017-04-21 ENCOUNTER — Other Ambulatory Visit: Payer: Self-pay | Admitting: Sports Medicine

## 2017-05-22 ENCOUNTER — Other Ambulatory Visit: Payer: Self-pay | Admitting: Sports Medicine

## 2017-06-20 ENCOUNTER — Other Ambulatory Visit: Payer: Self-pay | Admitting: Sports Medicine

## 2017-07-08 DIAGNOSIS — J069 Acute upper respiratory infection, unspecified: Secondary | ICD-10-CM | POA: Diagnosis not present

## 2017-07-08 DIAGNOSIS — Z6836 Body mass index (BMI) 36.0-36.9, adult: Secondary | ICD-10-CM | POA: Diagnosis not present

## 2017-07-08 DIAGNOSIS — I10 Essential (primary) hypertension: Secondary | ICD-10-CM | POA: Insufficient documentation

## 2017-07-19 ENCOUNTER — Other Ambulatory Visit: Payer: Self-pay | Admitting: Sports Medicine

## 2017-07-22 ENCOUNTER — Encounter: Payer: Self-pay | Admitting: Sports Medicine

## 2017-07-22 ENCOUNTER — Ambulatory Visit (INDEPENDENT_AMBULATORY_CARE_PROVIDER_SITE_OTHER): Payer: BLUE CROSS/BLUE SHIELD | Admitting: Sports Medicine

## 2017-07-22 DIAGNOSIS — E291 Testicular hypofunction: Secondary | ICD-10-CM | POA: Diagnosis not present

## 2017-07-22 DIAGNOSIS — Z0000 Encounter for general adult medical examination without abnormal findings: Secondary | ICD-10-CM | POA: Diagnosis not present

## 2017-07-22 DIAGNOSIS — G4719 Other hypersomnia: Secondary | ICD-10-CM

## 2017-07-22 DIAGNOSIS — E785 Hyperlipidemia, unspecified: Secondary | ICD-10-CM

## 2017-07-22 DIAGNOSIS — I10 Essential (primary) hypertension: Secondary | ICD-10-CM | POA: Diagnosis not present

## 2017-07-22 MED ORDER — ATORVASTATIN CALCIUM 40 MG PO TABS: 40.0000 mg | ORAL_TABLET | Freq: Every day | ORAL | 3 refills | Status: DC

## 2017-07-22 MED ORDER — AMLODIPINE BESYLATE 10 MG PO TABS: 10.0000 mg | ORAL_TABLET | Freq: Every day | ORAL | 3 refills | Status: DC

## 2017-07-22 MED ORDER — LISINOPRIL-HYDROCHLOROTHIAZIDE 20-25 MG PO TABS: 1.0000 | ORAL_TABLET | Freq: Every day | ORAL | 3 refills | Status: DC

## 2017-07-22 NOTE — Assessment & Plan Note (Signed)
Restarting medication, starting evaluation for obstructive sleep apnea. Return in 2 weeks to reevaluate blood pressure in a nurse visit.

## 2017-07-22 NOTE — Progress Notes (Signed)
  Subjective:    CC: Annual physical exam  HPI:  Hypertension: Uncontrolled, has been noncompliant with medications, no headaches, visual changes, chest pain.  Hyperlipidemia: Has been off of medications.  Excessive daytime sleepiness: With the big neck, obesity, difficult to hypertension, cupping of the tongue, he desires to proceed with sleep study.  Past medical history:  Negative.  See flowsheet/record as well for more information.  Surgical history: Negative.  See flowsheet/record as well for more information.  Family history: Negative.  See flowsheet/record as well for more information.  Social history: Negative.  See flowsheet/record as well for more information.  Allergies, and medications have been entered into the medical record, reviewed, and no changes needed.    Review of Systems: No headache, visual changes, nausea, vomiting, diarrhea, constipation, dizziness, abdominal pain, skin rash, fevers, chills, night sweats, swollen lymph nodes, weight loss, chest pain, body aches, joint swelling, muscle aches, shortness of breath, mood changes, visual or auditory hallucinations.  Objective:    General: Well Developed, well nourished, and in no acute distress.  Neuro: Alert and oriented x3, extra-ocular muscles intact, sensation grossly intact. Cranial nerves II through XII are intact, motor, sensory, and coordinative functions are all intact. HEENT: Normocephalic, atraumatic, pupils equal round reactive to light, neck supple, no masses, no lymphadenopathy, thyroid nonpalpable. Oropharynx, nasopharynx, external ear canals are unremarkable. Skin: Warm and dry, no rashes noted.  Cardiac: Regular rate and rhythm, no murmurs rubs or gallops.  Respiratory: Clear to auscultation bilaterally. Not using accessory muscles, speaking in full sentences.  Abdominal: Soft, nontender, nondistended, positive bowel sounds, no masses, no organomegaly.  Musculoskeletal: Shoulder, elbow, wrist, hip,  knee, ankle stable, and with full range of motion.  Impression and Recommendations:    The patient was counselled, risk factors were discussed, anticipatory guidance given.  Annual physical exam Routine blood work ordered, I have not seen him in several years. Declines influenza vaccine.  Hyperlipidemia Restarting cholesterol medication, recheck in 2 months.  Benign essential hypertension Restarting medication, starting evaluation for obstructive sleep apnea. Return in 2 weeks to reevaluate blood pressure in a nurse visit.  Male hypogonadism Rechecking testosterone levels.  Excessive daytime sleepiness 18-1/2 inch neck, daytime sleepiness, difficult to control hypertension. Adding home sleep study. ___________________________________________ Jeremiah Ward, M.D., ABFM., CAQSM. Primary Care and Sports Medicine [facility]  Adjunct Instructor of Family Medicine  [employer]

## 2017-07-22 NOTE — Assessment & Plan Note (Signed)
Restarting cholesterol medication, recheck in 2 months.

## 2017-07-22 NOTE — Assessment & Plan Note (Signed)
18-1/2 inch neck, daytime sleepiness, difficult to control hypertension. Adding home sleep study.

## 2017-07-22 NOTE — Assessment & Plan Note (Signed)
Rechecking testosterone levels. 

## 2017-07-22 NOTE — Progress Notes (Signed)
Pt stated his dentist wants him to get check for sleep apnea.

## 2017-07-22 NOTE — Assessment & Plan Note (Signed)
Routine blood work ordered, I have not seen him in several years. Declines influenza vaccine.

## 2017-08-05 ENCOUNTER — Ambulatory Visit: Payer: BLUE CROSS/BLUE SHIELD

## 2017-08-17 ENCOUNTER — Other Ambulatory Visit: Payer: Self-pay | Admitting: Sports Medicine

## 2018-07-22 ENCOUNTER — Other Ambulatory Visit: Payer: Self-pay | Admitting: Sports Medicine

## 2018-07-22 DIAGNOSIS — I10 Essential (primary) hypertension: Secondary | ICD-10-CM

## 2018-10-13 ENCOUNTER — Other Ambulatory Visit: Payer: Self-pay | Admitting: Sports Medicine

## 2018-10-13 DIAGNOSIS — I10 Essential (primary) hypertension: Secondary | ICD-10-CM

## 2018-11-07 ENCOUNTER — Ambulatory Visit (INDEPENDENT_AMBULATORY_CARE_PROVIDER_SITE_OTHER): Payer: BLUE CROSS/BLUE SHIELD | Admitting: Sports Medicine

## 2018-11-07 ENCOUNTER — Other Ambulatory Visit: Payer: Self-pay

## 2018-11-07 ENCOUNTER — Encounter: Payer: Self-pay | Admitting: Sports Medicine

## 2018-11-07 VITALS — BP 117/78 | HR 90 | Ht 71.0 in | Wt 259.0 lb

## 2018-11-07 DIAGNOSIS — Z0000 Encounter for general adult medical examination without abnormal findings: Secondary | ICD-10-CM | POA: Diagnosis not present

## 2018-11-07 DIAGNOSIS — Z23 Encounter for immunization: Secondary | ICD-10-CM

## 2018-11-07 DIAGNOSIS — L918 Other hypertrophic disorders of the skin: Secondary | ICD-10-CM | POA: Diagnosis not present

## 2018-11-07 DIAGNOSIS — I10 Essential (primary) hypertension: Secondary | ICD-10-CM | POA: Diagnosis not present

## 2018-11-07 DIAGNOSIS — E785 Hyperlipidemia, unspecified: Secondary | ICD-10-CM | POA: Diagnosis not present

## 2018-11-07 DIAGNOSIS — E291 Testicular hypofunction: Secondary | ICD-10-CM | POA: Diagnosis not present

## 2018-11-07 DIAGNOSIS — L821 Other seborrheic keratosis: Secondary | ICD-10-CM | POA: Insufficient documentation

## 2018-11-07 MED ORDER — ATORVASTATIN CALCIUM 40 MG PO TABS: 40.0000 mg | ORAL_TABLET | Freq: Every day | ORAL | 3 refills | Status: DC

## 2018-11-07 MED ORDER — LISINOPRIL-HYDROCHLOROTHIAZIDE 20-25 MG PO TABS: 1.0000 | ORAL_TABLET | Freq: Every day | ORAL | 3 refills | Status: DC

## 2018-11-07 MED ORDER — AMLODIPINE BESYLATE 10 MG PO TABS: 10.0000 mg | ORAL_TABLET | Freq: Every day | ORAL | 3 refills | Status: DC

## 2018-11-07 NOTE — Assessment & Plan Note (Signed)
Well controlled, no changes 

## 2018-11-07 NOTE — Assessment & Plan Note (Signed)
Annual physical as above, checking routine labs. 

## 2018-11-07 NOTE — Assessment & Plan Note (Signed)
Cryotherapy of a left lower abdominal acrochordon.

## 2018-11-07 NOTE — Assessment & Plan Note (Addendum)
Rechecking labs, stable.  Labs look ok except trigs which are pretty high.  Adding lovaza.  Recheck in 3 months.

## 2018-11-07 NOTE — Progress Notes (Addendum)
Subjective:    CC: Annual physical exam  HPI:  Hypertension: Well-controlled on amlodipine and lisinopril HCTZ.  Hyperlipidemia: Stable and well-controlled on atorvastatin 40.  Skin tag: Left-sided lower abdomen at the belt line, tends to pick at it, and it tends to irritate him.  I reviewed the past medical history, family history, social history, surgical history, and allergies today and no changes were needed.  Please see the problem list section below in epic for further details.  Past Medical History: Past Medical History:  Diagnosis Date  . Hypertension    untreated   Past Surgical History: Past Surgical History:  Procedure Laterality Date  . HERNIA REPAIR    . VASECTOMY     Social History: Social History   Socioeconomic History  . Marital status: Married    Spouse name: Not on file  . Number of children: Not on file  . Years of education: Not on file  . Highest education level: Not on file  Occupational History  . Not on file  Social Needs  . Financial resource strain: Not on file  . Food insecurity:    Worry: Not on file    Inability: Not on file  . Transportation needs:    Medical: Not on file    Non-medical: Not on file  Tobacco Use  . Smoking status: Never Smoker  . Smokeless tobacco: Never Used  Substance and Sexual Activity  . Alcohol use: Yes  . Drug use: No  . Sexual activity: Yes  Lifestyle  . Physical activity:    Days per week: Not on file    Minutes per session: Not on file  . Stress: Not on file  Relationships  . Social connections:    Talks on phone: Not on file    Gets together: Not on file    Attends religious service: Not on file    Active member of club or organization: Not on file    Attends meetings of clubs or organizations: Not on file    Relationship status: Not on file  Other Topics Concern  . Not on file  Social History Narrative  . Not on file   Family History: Family History  Problem Relation Age of Onset  .  Hypertension Mother    Allergies: Allergies  Allergen Reactions  . Celestone Soluspan [Betamethasone Sod Phos & Acet] Anxiety   Medications: See med rec.  Review of Systems: No headache, visual changes, nausea, vomiting, diarrhea, constipation, dizziness, abdominal pain, skin rash, fevers, chills, night sweats, swollen lymph nodes, weight loss, chest pain, body aches, joint swelling, muscle aches, shortness of breath, mood changes, visual or auditory hallucinations.  Objective:    General: Well Developed, well nourished, and in no acute distress.  Neuro: Alert and oriented x3, extra-ocular muscles intact, sensation grossly intact. Cranial nerves II through XII are intact, motor, sensory, and coordinative functions are all intact. HEENT: Normocephalic, atraumatic, pupils equal round reactive to light, neck supple, no masses, no lymphadenopathy, thyroid nonpalpable. Oropharynx, nasopharynx, external ear canals are unremarkable. Skin: Warm and dry, no rashes noted.  Large wart on the left lower abdomen Cardiac: Regular rate and rhythm, no murmurs rubs or gallops.  Respiratory: Clear to auscultation bilaterally. Not using accessory muscles, speaking in full sentences.  Abdominal: Soft, nontender, nondistended, positive bowel sounds, no masses, no organomegaly.  Musculoskeletal: Shoulder, elbow, wrist, hip, knee, ankle stable, and with full range of motion.  Procedure:  Cryodestruction of skin tag Consent obtained and verified. Time-out conducted. Noted  no overlying erythema, induration, or other signs of local infection. Completed without difficulty using Cryo-Gun. Advised to call if fevers/chills, erythema, induration, drainage, or persistent bleeding.  Impression and Recommendations:    The patient was counselled, risk factors were discussed, anticipatory guidance given.  Annual physical exam Annual physical as above, checking routine labs.  Benign essential hypertension  Well-controlled, no changes.  Hyperlipidemia Rechecking labs, stable.  Labs look ok except trigs which are pretty high.  Adding lovaza.  Recheck in 3 months.  Acrochordon Cryotherapy of a left lower abdominal acrochordon.   ___________________________________________ Ihor Austin. Benjamin Stain, M.D., ABFM., CAQSM. Primary Care and Sports Medicine [facility]  Adjunct Professor of Family Medicine  [employer]

## 2018-11-08 MED ORDER — OMEGA-3-ACID ETHYL ESTERS 1 G PO CAPS: 2.0000 g | ORAL_CAPSULE | Freq: Two times a day (BID) | ORAL | 3 refills | Status: DC

## 2018-11-08 NOTE — Addendum Note (Signed)
Addended by: Monica Becton on: 11/08/2018 02:21 PM   Modules accepted: Orders

## 2018-11-10 LAB — COMPREHENSIVE METABOLIC PANEL WITH GFR
AG Ratio: 1.8 (calc) (ref 1.0–2.5)
CO2: 27 mmol/L (ref 20–32)
Chloride: 101 mmol/L (ref 98–110)

## 2018-11-10 LAB — HIV ANTIBODY (ROUTINE TESTING W REFLEX): HIV 1&2 Ab, 4th Generation: NONREACTIVE

## 2018-11-10 LAB — PSA, TOTAL AND FREE
PSA, % Free: 22 % (calc) — ABNORMAL LOW (ref >25)
PSA, Free: 0.2 ng/mL
PSA, Total: 0.9 ng/mL (ref <=4.0)

## 2018-11-10 LAB — COMPREHENSIVE METABOLIC PANEL
ALT: 30 U/L (ref 9–46)
AST: 24 U/L (ref 10–40)
Albumin: 4.9 g/dL (ref 3.6–5.1)
Alkaline phosphatase (APISO): 49 U/L (ref 36–130)
BUN: 16 mg/dL (ref 7–25)
Calcium: 10.2 mg/dL (ref 8.6–10.3)
Creat: 1.26 mg/dL (ref 0.60–1.35)
Globulin: 2.8 g/dL (calc) (ref 1.9–3.7)
Glucose, Bld: 102 mg/dL — ABNORMAL HIGH (ref 65–99)
Potassium: 3.9 mmol/L (ref 3.5–5.3)
Sodium: 140 mmol/L (ref 135–146)
Total Bilirubin: 1.5 mg/dL — ABNORMAL HIGH (ref 0.2–1.2)
Total Protein: 7.7 g/dL (ref 6.1–8.1)

## 2018-11-10 LAB — CBC
HCT: 43.5 % (ref 38.5–50.0)
Hemoglobin: 14.8 g/dL (ref 13.2–17.1)
MCH: 29.6 pg (ref 27.0–33.0)
MCHC: 34 g/dL (ref 32.0–36.0)
MCV: 87 fL (ref 80.0–100.0)
MPV: 10.4 fL (ref 7.5–12.5)
Platelets: 339 10*3/uL (ref 140–400)
RBC: 5 Million/uL (ref 4.20–5.80)
RDW: 13.2 % (ref 11.0–15.0)
WBC: 6.7 10*3/uL (ref 3.8–10.8)

## 2018-11-10 LAB — HEMOGLOBIN A1C
Hgb A1c MFr Bld: 6.1 % of total Hgb — ABNORMAL HIGH (ref <5.7)
Mean Plasma Glucose: 128 (calc)
eAG (mmol/L): 7.1 (calc)

## 2018-11-10 LAB — LIPID PANEL W/REFLEX DIRECT LDL
Cholesterol: 145 mg/dL (ref <200)
HDL: 27 mg/dL — ABNORMAL LOW (ref >=40)
LDL Cholesterol (Calc): 82 mg/dL (calc)
Non-HDL Cholesterol (Calc): 118 mg/dL (calc) (ref <130)
Total CHOL/HDL Ratio: 5.4 (calc) — ABNORMAL HIGH (ref <5.0)
Triglycerides: 299 mg/dL — ABNORMAL HIGH (ref <150)

## 2018-11-10 LAB — TESTOSTERONE, FREE & TOTAL
Free Testosterone: 54.4 pg/mL (ref 35.0–155.0)
Testosterone, Total, LC-MS-MS: 254 ng/dL (ref 250–1100)

## 2018-11-10 LAB — TSH: TSH: 2.21 mIU/L (ref 0.40–4.50)

## 2019-07-09 ENCOUNTER — Other Ambulatory Visit: Payer: Self-pay | Admitting: *Deleted

## 2019-07-09 DIAGNOSIS — Z20822 Contact with and (suspected) exposure to covid-19: Secondary | ICD-10-CM

## 2019-07-11 LAB — NOVEL CORONAVIRUS, NAA: SARS-CoV-2, NAA: NOT DETECTED

## 2019-10-23 ENCOUNTER — Other Ambulatory Visit: Payer: Self-pay | Admitting: Sports Medicine

## 2019-10-23 DIAGNOSIS — I10 Essential (primary) hypertension: Secondary | ICD-10-CM

## 2019-10-23 DIAGNOSIS — E785 Hyperlipidemia, unspecified: Secondary | ICD-10-CM

## 2019-10-23 MED ORDER — AMLODIPINE BESYLATE 10 MG PO TABS: 10.0000 mg | ORAL_TABLET | Freq: Every day | ORAL | 3 refills | Status: DC

## 2019-10-23 MED ORDER — LISINOPRIL-HYDROCHLOROTHIAZIDE 20-25 MG PO TABS: 1.0000 | ORAL_TABLET | Freq: Every day | ORAL | 3 refills | Status: DC

## 2019-10-23 MED ORDER — ATORVASTATIN CALCIUM 40 MG PO TABS: 40.0000 mg | ORAL_TABLET | Freq: Every day | ORAL | 3 refills | Status: DC

## 2019-10-23 MED ORDER — OMEGA-3-ACID ETHYL ESTERS 1 G PO CAPS: 2.0000 g | ORAL_CAPSULE | Freq: Two times a day (BID) | ORAL | 3 refills | Status: DC

## 2019-11-07 ENCOUNTER — Encounter: Payer: BC Managed Care – PPO | Admitting: Sports Medicine

## 2020-05-07 ENCOUNTER — Encounter: Payer: Self-pay | Admitting: Nurse Practitioner

## 2020-05-07 ENCOUNTER — Other Ambulatory Visit: Payer: Self-pay | Admitting: Nurse Practitioner

## 2020-05-07 DIAGNOSIS — R7303 Prediabetes: Secondary | ICD-10-CM | POA: Insufficient documentation

## 2020-11-04 ENCOUNTER — Other Ambulatory Visit: Payer: Self-pay

## 2020-11-04 ENCOUNTER — Ambulatory Visit (INDEPENDENT_AMBULATORY_CARE_PROVIDER_SITE_OTHER): Payer: BC Managed Care – PPO | Admitting: Sports Medicine

## 2020-11-04 ENCOUNTER — Encounter: Payer: Self-pay | Admitting: Sports Medicine

## 2020-11-04 VITALS — BP 128/79 | HR 68 | Ht 71.0 in | Wt 264.0 lb

## 2020-11-04 DIAGNOSIS — I10 Essential (primary) hypertension: Secondary | ICD-10-CM

## 2020-11-04 DIAGNOSIS — E291 Testicular hypofunction: Secondary | ICD-10-CM

## 2020-11-04 DIAGNOSIS — Z1211 Encounter for screening for malignant neoplasm of colon: Secondary | ICD-10-CM

## 2020-11-04 DIAGNOSIS — E785 Hyperlipidemia, unspecified: Secondary | ICD-10-CM

## 2020-11-04 DIAGNOSIS — Z0000 Encounter for general adult medical examination without abnormal findings: Secondary | ICD-10-CM

## 2020-11-04 DIAGNOSIS — R7303 Prediabetes: Secondary | ICD-10-CM

## 2020-11-04 DIAGNOSIS — L821 Other seborrheic keratosis: Secondary | ICD-10-CM

## 2020-11-04 NOTE — Assessment & Plan Note (Signed)
There are a couple of irritated seborrheic keratoses on the posterior scalp, we will avoid scratching these and continue to wear sunscreen.

## 2020-11-04 NOTE — Assessment & Plan Note (Signed)
Well controlled, no changes 

## 2020-11-04 NOTE — Progress Notes (Signed)
  Subjective:    CC: Annual Physical Exam  HPI:  This patient is here for their annual physical  I reviewed the past medical history, family history, social history, surgical history, and allergies today and no changes were needed.  Please see the problem list section below in epic for further details.  Past Medical History: Past Medical History:  Diagnosis Date  . Hypertension    untreated   Past Surgical History: Past Surgical History:  Procedure Laterality Date  . HERNIA REPAIR    . VASECTOMY     Social History: Social History   Socioeconomic History  . Marital status: Married    Spouse name: Not on file  . Number of children: Not on file  . Years of education: Not on file  . Highest education level: Not on file  Occupational History  . Not on file  Tobacco Use  . Smoking status: Never Smoker  . Smokeless tobacco: Never Used  Substance and Sexual Activity  . Alcohol use: Yes  . Drug use: No  . Sexual activity: Yes  Other Topics Concern  . Not on file  Social History Narrative  . Not on file   Social Determinants of Health   Financial Resource Strain: Not on file  Food Insecurity: Not on file  Transportation Needs: Not on file  Physical Activity: Not on file  Stress: Not on file  Social Connections: Not on file   Family History: Family History  Problem Relation Age of Onset  . Hypertension Mother    Allergies: Allergies  Allergen Reactions  . Celestone Soluspan [Betamethasone Sod Phos & Acet] Anxiety   Medications: See med rec.  Review of Systems: No headache, visual changes, nausea, vomiting, diarrhea, constipation, dizziness, abdominal pain, skin rash, fevers, chills, night sweats, swollen lymph nodes, weight loss, chest pain, body aches, joint swelling, muscle aches, shortness of breath, mood changes, visual or auditory hallucinations.  Objective:    General: Well Developed, well nourished, and in no acute distress.  Neuro: Alert and oriented  x3, extra-ocular muscles intact, sensation grossly intact. Cranial nerves II through XII are intact, motor, sensory, and coordinative functions are all intact. HEENT: Normocephalic, atraumatic, pupils equal round reactive to light, neck supple, no masses, no lymphadenopathy, thyroid nonpalpable. Oropharynx, nasopharynx, external ear canals are unremarkable. Skin: Warm and dry, no rashes noted.  Irritated/excoriated seborrheic keratoses left posterior scalp. Cardiac: Regular rate and rhythm, no murmurs rubs or gallops.  Respiratory: Clear to auscultation bilaterally. Not using accessory muscles, speaking in full sentences.  Abdominal: Soft, nontender, nondistended, positive bowel sounds, no masses, no organomegaly.  Musculoskeletal: Shoulder, elbow, wrist, hip, knee, ankle stable, and with full range of motion.  Impression and Recommendations:    The patient was counselled, risk factors were discussed, anticipatory guidance given.  Annual physical exam Annual physical as above, ordering routine labs. Cologuard ordered. Return to see me in a year.  Benign essential hypertension Well-controlled, no changes.  Hyperlipidemia Continues with Lovaza and atorvastatin, has not been tremendously compliant with Lovaza, he will get his labs checked maybe in a couple of weeks.  Pre-diabetes Rechecking hemoglobin A1c today.  Seborrheic keratosis There are a couple of irritated seborrheic keratoses on the posterior scalp, we will avoid scratching these and continue to wear sunscreen.   ___________________________________________ Ihor Austin. Benjamin Stain, M.D., ABFM., CAQSM. Primary Care and Sports Medicine [facility]  Adjunct Professor of Family Medicine  [employer]

## 2020-11-04 NOTE — Assessment & Plan Note (Signed)
Annual physical as above, ordering routine labs. Cologuard ordered. Return to see me in a year.

## 2020-11-04 NOTE — Assessment & Plan Note (Signed)
Continues with Lovaza and atorvastatin, has not been tremendously compliant with Lovaza, he will get his labs checked maybe in a couple of weeks.

## 2020-11-04 NOTE — Assessment & Plan Note (Signed)
Rechecking hemoglobin A1c today.

## 2020-12-19 DIAGNOSIS — R7303 Prediabetes: Secondary | ICD-10-CM | POA: Diagnosis not present

## 2020-12-19 DIAGNOSIS — E291 Testicular hypofunction: Secondary | ICD-10-CM | POA: Diagnosis not present

## 2020-12-19 DIAGNOSIS — E785 Hyperlipidemia, unspecified: Secondary | ICD-10-CM | POA: Diagnosis not present

## 2020-12-19 DIAGNOSIS — I10 Essential (primary) hypertension: Secondary | ICD-10-CM | POA: Diagnosis not present

## 2020-12-22 LAB — TSH: TSH: 3.38 mIU/L (ref 0.40–4.50)

## 2020-12-22 LAB — COMPREHENSIVE METABOLIC PANEL
AG Ratio: 1.8 (calc) (ref 1.0–2.5)
ALT: 25 U/L (ref 9–46)
AST: 25 U/L (ref 10–40)
Albumin: 4.8 g/dL (ref 3.6–5.1)
Alkaline phosphatase (APISO): 51 U/L (ref 36–130)
BUN: 18 mg/dL (ref 7–25)
CO2: 32 mmol/L (ref 20–32)
Calcium: 10.3 mg/dL (ref 8.6–10.3)
Chloride: 101 mmol/L (ref 98–110)
Creat: 1.16 mg/dL (ref 0.60–1.35)
Globulin: 2.7 g/dL (calc) (ref 1.9–3.7)
Glucose, Bld: 95 mg/dL (ref 65–99)
Potassium: 3.7 mmol/L (ref 3.5–5.3)
Sodium: 141 mmol/L (ref 135–146)
Total Bilirubin: 1.5 mg/dL — ABNORMAL HIGH (ref 0.2–1.2)
Total Protein: 7.5 g/dL (ref 6.1–8.1)

## 2020-12-22 LAB — CBC
HCT: 43.9 % (ref 38.5–50.0)
Hemoglobin: 14.7 g/dL (ref 13.2–17.1)
MCH: 30.3 pg (ref 27.0–33.0)
MCHC: 33.5 g/dL (ref 32.0–36.0)
MCV: 90.5 fL (ref 80.0–100.0)
MPV: 10.3 fL (ref 7.5–12.5)
Platelets: 278 10*3/uL (ref 140–400)
RBC: 4.85 10*6/uL (ref 4.20–5.80)
RDW: 13.1 % (ref 11.0–15.0)
WBC: 6.8 10*3/uL (ref 3.8–10.8)

## 2020-12-22 LAB — HEMOGLOBIN A1C
Hgb A1c MFr Bld: 5.7 % of total Hgb — ABNORMAL HIGH (ref <5.7)
Mean Plasma Glucose: 117 mg/dL
eAG (mmol/L): 6.5 mmol/L

## 2020-12-22 LAB — HEPATITIS C ANTIBODY
Hepatitis C Ab: NONREACTIVE
SIGNAL TO CUT-OFF: 0 (ref <1.00)

## 2020-12-22 LAB — LIPID PANEL
Cholesterol: 143 mg/dL (ref <200)
HDL: 31 mg/dL — ABNORMAL LOW (ref >=40)
LDL Cholesterol (Calc): 82 mg/dL (calc)
Non-HDL Cholesterol (Calc): 112 mg/dL (calc) (ref <130)
Total CHOL/HDL Ratio: 4.6 (calc) (ref <5.0)
Triglycerides: 209 mg/dL — ABNORMAL HIGH (ref <150)

## 2020-12-22 LAB — TESTOSTERONE, FREE & TOTAL
Free Testosterone: 72.8 pg/mL (ref 35.0–155.0)
Testosterone, Total, LC-MS-MS: 321 ng/dL (ref 250–1100)

## 2020-12-23 ENCOUNTER — Other Ambulatory Visit: Payer: Self-pay

## 2020-12-23 DIAGNOSIS — E785 Hyperlipidemia, unspecified: Secondary | ICD-10-CM

## 2020-12-23 DIAGNOSIS — I10 Essential (primary) hypertension: Secondary | ICD-10-CM

## 2020-12-23 MED ORDER — AMLODIPINE BESYLATE 10 MG PO TABS: 10.0000 mg | ORAL_TABLET | Freq: Every day | ORAL | 0 refills | Status: DC

## 2020-12-23 MED ORDER — LISINOPRIL-HYDROCHLOROTHIAZIDE 20-25 MG PO TABS: 1.0000 | ORAL_TABLET | Freq: Every day | ORAL | 0 refills | Status: DC

## 2020-12-23 NOTE — Telephone Encounter (Signed)
Patient called stating that he was out of his medications. He was last seen March 2021. Prescriptions pended for #30 with no refills so patient may get in for a check up.

## 2021-01-16 ENCOUNTER — Other Ambulatory Visit: Payer: Self-pay

## 2021-01-16 DIAGNOSIS — I10 Essential (primary) hypertension: Secondary | ICD-10-CM

## 2021-01-16 DIAGNOSIS — E785 Hyperlipidemia, unspecified: Secondary | ICD-10-CM

## 2021-01-16 MED ORDER — LISINOPRIL-HYDROCHLOROTHIAZIDE 20-25 MG PO TABS: 1.0000 | ORAL_TABLET | Freq: Every day | ORAL | 6 refills | Status: DC

## 2021-01-16 MED ORDER — AMLODIPINE BESYLATE 10 MG PO TABS: 10.0000 mg | ORAL_TABLET | Freq: Every day | ORAL | 6 refills | Status: DC

## 2021-02-02 DIAGNOSIS — Z1211 Encounter for screening for malignant neoplasm of colon: Secondary | ICD-10-CM | POA: Diagnosis not present

## 2021-02-06 LAB — COLOGUARD: Cologuard: NEGATIVE

## 2021-02-07 LAB — COLOGUARD: COLOGUARD: NEGATIVE

## 2021-02-10 ENCOUNTER — Encounter: Payer: Self-pay | Admitting: Sports Medicine

## 2021-02-10 NOTE — Progress Notes (Signed)
Cologuard results abstracted into chart, VM full at home and mobile numbers with no answer at work number, results sent for scanning.   Attempted to reach patient using all 3 phone numbers in chart. Unable to reach patient or leave a message. Sent patient a message through [facility] with results.

## 2021-04-09 ENCOUNTER — Other Ambulatory Visit: Payer: Self-pay

## 2021-04-09 DIAGNOSIS — E785 Hyperlipidemia, unspecified: Secondary | ICD-10-CM

## 2021-04-09 DIAGNOSIS — I10 Essential (primary) hypertension: Secondary | ICD-10-CM

## 2021-04-09 NOTE — Telephone Encounter (Signed)
Last OV: 11/04/20 Last fill: 10/23/2019  Patient called to inquire why his refill was denied. He has been seen recently and did have labs drawn.

## 2021-04-10 MED ORDER — ATORVASTATIN CALCIUM 40 MG PO TABS: 40.0000 mg | ORAL_TABLET | Freq: Every day | ORAL | 3 refills | Status: DC

## 2021-04-10 NOTE — Telephone Encounter (Signed)
Patient aware prescription sent to pharmacy. 

## 2021-08-13 ENCOUNTER — Other Ambulatory Visit: Payer: Self-pay

## 2021-08-13 DIAGNOSIS — E785 Hyperlipidemia, unspecified: Secondary | ICD-10-CM

## 2021-08-13 DIAGNOSIS — I10 Essential (primary) hypertension: Secondary | ICD-10-CM

## 2021-08-13 MED ORDER — AMLODIPINE BESYLATE 10 MG PO TABS: 10.0000 mg | ORAL_TABLET | Freq: Every day | ORAL | 3 refills | Status: DC

## 2021-08-13 MED ORDER — LISINOPRIL-HYDROCHLOROTHIAZIDE 20-25 MG PO TABS: 1.0000 | ORAL_TABLET | Freq: Every day | ORAL | 3 refills | Status: DC

## 2021-12-28 ENCOUNTER — Other Ambulatory Visit: Payer: Self-pay

## 2021-12-28 DIAGNOSIS — E785 Hyperlipidemia, unspecified: Secondary | ICD-10-CM

## 2021-12-28 DIAGNOSIS — I10 Essential (primary) hypertension: Secondary | ICD-10-CM

## 2021-12-28 MED ORDER — AMLODIPINE BESYLATE 10 MG PO TABS: 10.0000 mg | ORAL_TABLET | Freq: Every day | ORAL | 0 refills | Status: DC

## 2021-12-28 MED ORDER — LISINOPRIL-HYDROCHLOROTHIAZIDE 20-25 MG PO TABS: 1.0000 | ORAL_TABLET | Freq: Every day | ORAL | 0 refills | Status: DC

## 2022-01-04 ENCOUNTER — Encounter: Payer: Self-pay | Admitting: Sports Medicine

## 2022-01-04 ENCOUNTER — Ambulatory Visit (INDEPENDENT_AMBULATORY_CARE_PROVIDER_SITE_OTHER): Payer: BC Managed Care – PPO | Admitting: Sports Medicine

## 2022-01-04 VITALS — BP 126/83 | HR 90 | Ht 71.0 in | Wt 272.0 lb

## 2022-01-04 DIAGNOSIS — E785 Hyperlipidemia, unspecified: Secondary | ICD-10-CM | POA: Diagnosis not present

## 2022-01-04 DIAGNOSIS — Z23 Encounter for immunization: Secondary | ICD-10-CM | POA: Diagnosis not present

## 2022-01-04 DIAGNOSIS — I10 Essential (primary) hypertension: Secondary | ICD-10-CM | POA: Diagnosis not present

## 2022-01-04 DIAGNOSIS — N139 Obstructive and reflux uropathy, unspecified: Secondary | ICD-10-CM | POA: Diagnosis not present

## 2022-01-04 DIAGNOSIS — Z0000 Encounter for general adult medical examination without abnormal findings: Secondary | ICD-10-CM | POA: Diagnosis not present

## 2022-01-04 MED ORDER — LISINOPRIL-HYDROCHLOROTHIAZIDE 20-25 MG PO TABS: 1.0000 | ORAL_TABLET | Freq: Every day | ORAL | 3 refills | Status: DC

## 2022-01-04 MED ORDER — ATORVASTATIN CALCIUM 40 MG PO TABS: 40.0000 mg | ORAL_TABLET | Freq: Every day | ORAL | 3 refills | Status: DC

## 2022-01-04 MED ORDER — OMEGA-3-ACID ETHYL ESTERS 1 G PO CAPS: 2.0000 g | ORAL_CAPSULE | Freq: Two times a day (BID) | ORAL | 3 refills | Status: DC

## 2022-01-04 MED ORDER — AMLODIPINE BESYLATE 10 MG PO TABS: 10.0000 mg | ORAL_TABLET | Freq: Every day | ORAL | 3 refills | Status: DC

## 2022-01-04 NOTE — Progress Notes (Signed)
?  Subjective:   ? ?CC: Annual Physical Exam ? ?HPI:  ?This patient is here for their annual physical ? ?I reviewed the past medical history, family history, social history, surgical history, and allergies today and no changes were needed.  Please see the problem list section below in epic for further details. ? ?Past Medical History: ?Past Medical History:  ?Diagnosis Date  ? Hypertension   ? untreated  ? ?Past Surgical History: ?Past Surgical History:  ?Procedure Laterality Date  ? HERNIA REPAIR    ? VASECTOMY    ? ?Social History: ?Social History  ? ?Socioeconomic History  ? Marital status: Married  ?  Spouse name: Not on file  ? Number of children: Not on file  ? Years of education: Not on file  ? Highest education level: Not on file  ?Occupational History  ? Not on file  ?Tobacco Use  ? Smoking status: Never  ? Smokeless tobacco: Never  ?Substance and Sexual Activity  ? Alcohol use: Yes  ? Drug use: No  ? Sexual activity: Yes  ?Other Topics Concern  ? Not on file  ?Social History Narrative  ? Not on file  ? ?Social Determinants of Health  ? ?Financial Resource Strain: Not on file  ?Food Insecurity: Not on file  ?Transportation Needs: Not on file  ?Physical Activity: Not on file  ?Stress: Not on file  ?Social Connections: Not on file  ? ?Family History: ?Family History  ?Problem Relation Age of Onset  ? Hypertension Mother   ? ?Allergies: ?Allergies  ?Allergen Reactions  ? Celestone Soluspan [Betamethasone Sod Phos & Acet] Anxiety  ? ?Medications: See med rec. ? ?Review of Systems: No headache, visual changes, nausea, vomiting, diarrhea, constipation, dizziness, abdominal pain, skin rash, fevers, chills, night sweats, swollen lymph nodes, weight loss, chest pain, body aches, joint swelling, muscle aches, shortness of breath, mood changes, visual or auditory hallucinations. ? ?Objective:   ? ?General: Well Developed, well nourished, and in no acute distress.  ?Neuro: Alert and oriented x3, extra-ocular muscles  intact, sensation grossly intact. Cranial nerves II through XII are intact, motor, sensory, and coordinative functions are all intact. ?HEENT: Normocephalic, atraumatic, pupils equal round reactive to light, neck supple, no masses, no lymphadenopathy, thyroid nonpalpable. Oropharynx, nasopharynx, external ear canals are unremarkable. ?Skin: Warm and dry, no rashes noted.  ?Cardiac: Regular rate and rhythm, no murmurs rubs or gallops.  ?Respiratory: Clear to auscultation bilaterally. Not using accessory muscles, speaking in full sentences.  ?Abdominal: Soft, nontender, nondistended, positive bowel sounds, no masses, no organomegaly.  ?Musculoskeletal: Shoulder, elbow, wrist, hip, knee, ankle stable, and with full range of motion. ? ?Impression and Recommendations:   ? ?The patient was counselled, risk factors were discussed, anticipatory guidance given. ? ?Annual physical exam ?Annual physical as above, Shingrix #1 today, nurse visit 2 to 6 months for Shingrix #2. ? ? ?___________________________________________ ?Ihor Austin. Benjamin Stain, M.D., ABFM., CAQSM. ?Primary Care and Sports Medicine ?[facility] ? ?Adjunct Professor of Family Medicine  ?[employer] ?

## 2022-01-04 NOTE — Addendum Note (Signed)
Addended by: Carolin Coy on: 01/04/2022 03:39 PM ? ? Modules accepted: Orders ? ?

## 2022-01-04 NOTE — Assessment & Plan Note (Signed)
Annual physical as above, Shingrix #1 today, nurse visit 2 to 6 months for Shingrix #2. ?

## 2022-04-05 ENCOUNTER — Ambulatory Visit: Payer: BC Managed Care – PPO

## 2023-01-14 ENCOUNTER — Other Ambulatory Visit: Payer: Self-pay | Admitting: Sports Medicine

## 2023-01-14 DIAGNOSIS — N139 Obstructive and reflux uropathy, unspecified: Secondary | ICD-10-CM | POA: Diagnosis not present

## 2023-01-14 DIAGNOSIS — I10 Essential (primary) hypertension: Secondary | ICD-10-CM | POA: Diagnosis not present

## 2023-01-14 DIAGNOSIS — E785 Hyperlipidemia, unspecified: Secondary | ICD-10-CM | POA: Diagnosis not present

## 2023-01-18 LAB — CBC
HCT: 44.6 % (ref 38.5–50.0)
Hemoglobin: 15 g/dL (ref 13.2–17.1)
MCH: 29.4 pg (ref 27.0–33.0)
MCHC: 33.6 g/dL (ref 32.0–36.0)
MCV: 87.5 fL (ref 80.0–100.0)
MPV: 10.2 fL (ref 7.5–12.5)
Platelets: 243 10*3/uL (ref 140–400)
RBC: 5.1 10*6/uL (ref 4.20–5.80)
RDW: 14.9 % (ref 11.0–15.0)
WBC: 6.2 10*3/uL (ref 3.8–10.8)

## 2023-01-18 LAB — COMPREHENSIVE METABOLIC PANEL
AG Ratio: 1.8 (calc) (ref 1.0–2.5)
ALT: 19 U/L (ref 9–46)
AST: 18 U/L (ref 10–35)
Albumin: 5 g/dL (ref 3.6–5.1)
Alkaline phosphatase (APISO): 52 U/L (ref 35–144)
BUN: 16 mg/dL (ref 7–25)
CO2: 29 mmol/L (ref 20–32)
Calcium: 10.4 mg/dL — ABNORMAL HIGH (ref 8.6–10.3)
Chloride: 103 mmol/L (ref 98–110)
Creat: 1.15 mg/dL (ref 0.70–1.30)
Globulin: 2.8 g/dL (calc) (ref 1.9–3.7)
Glucose, Bld: 100 mg/dL — ABNORMAL HIGH (ref 65–99)
Potassium: 4.1 mmol/L (ref 3.5–5.3)
Sodium: 142 mmol/L (ref 135–146)
Total Bilirubin: 1.1 mg/dL (ref 0.2–1.2)
Total Protein: 7.8 g/dL (ref 6.1–8.1)

## 2023-01-18 LAB — LIPID PANEL
Cholesterol: 147 mg/dL (ref <200)
HDL: 31 mg/dL — ABNORMAL LOW (ref >=40)
LDL Cholesterol (Calc): 85 mg/dL (calc)
Non-HDL Cholesterol (Calc): 116 mg/dL (calc) (ref <130)
Total CHOL/HDL Ratio: 4.7 (calc) (ref <5.0)
Triglycerides: 213 mg/dL — ABNORMAL HIGH (ref <150)

## 2023-01-18 LAB — TSH: TSH: 2.6 mIU/L (ref 0.40–4.50)

## 2023-01-18 LAB — PSA, TOTAL AND FREE
PSA, % Free: 10 % (calc) — ABNORMAL LOW (ref >25)
PSA, Free: 0.3 ng/mL
PSA, Total: 3 ng/mL (ref <=4.0)

## 2023-01-27 ENCOUNTER — Other Ambulatory Visit: Payer: Self-pay | Admitting: Sports Medicine

## 2023-01-27 DIAGNOSIS — E785 Hyperlipidemia, unspecified: Secondary | ICD-10-CM

## 2023-01-27 DIAGNOSIS — I10 Essential (primary) hypertension: Secondary | ICD-10-CM

## 2023-02-03 ENCOUNTER — Encounter: Payer: Self-pay | Admitting: Sports Medicine

## 2023-02-03 ENCOUNTER — Ambulatory Visit (INDEPENDENT_AMBULATORY_CARE_PROVIDER_SITE_OTHER): Payer: BC Managed Care – PPO | Admitting: Sports Medicine

## 2023-02-03 VITALS — BP 130/84 | HR 84 | Ht 71.0 in | Wt 255.0 lb

## 2023-02-03 DIAGNOSIS — I10 Essential (primary) hypertension: Secondary | ICD-10-CM

## 2023-02-03 DIAGNOSIS — R972 Elevated prostate specific antigen [PSA]: Secondary | ICD-10-CM

## 2023-02-03 DIAGNOSIS — E785 Hyperlipidemia, unspecified: Secondary | ICD-10-CM | POA: Diagnosis not present

## 2023-02-03 DIAGNOSIS — Z23 Encounter for immunization: Secondary | ICD-10-CM | POA: Diagnosis not present

## 2023-02-03 DIAGNOSIS — G4719 Other hypersomnia: Secondary | ICD-10-CM | POA: Diagnosis not present

## 2023-02-03 DIAGNOSIS — Z0000 Encounter for general adult medical examination without abnormal findings: Secondary | ICD-10-CM

## 2023-02-03 MED ORDER — FENOFIBRATE 160 MG PO TABS
160.0000 mg | ORAL_TABLET | Freq: Every day | ORAL | 3 refills | Status: DC
Start: 2023-02-03 — End: 2024-03-06

## 2023-02-03 NOTE — Assessment & Plan Note (Signed)
Annual physical as above, due for Shingrix, we discussed sleep hygiene, decreasing caffeinated beverage consumption to improve the sleep latency. Return to see me in 1 year for fasting annual physical.

## 2023-02-03 NOTE — Assessment & Plan Note (Signed)
Well controlled, no changes 

## 2023-02-03 NOTE — Addendum Note (Signed)
Addended by: Carren Rang A on: 02/03/2023 11:05 AM   Modules accepted: Orders

## 2023-02-03 NOTE — Assessment & Plan Note (Signed)
Has not been consistent with fish oil, triglycerides were a bit elevated, switching to fenofibrate with a 5-month lipid panel recheck.

## 2023-02-03 NOTE — Progress Notes (Addendum)
 Subjective:    CC: Annual Physical Exam  HPI:  This patient is here for their annual physical  I reviewed the past medical history, family history, social history, surgical history, and allergies today and no changes were needed.  Please see the problem list section below in epic for further details.  Past Medical History: Past Medical History:  Diagnosis Date   Hypertension    untreated   Past Surgical History: Past Surgical History:  Procedure Laterality Date   HERNIA REPAIR     VASECTOMY     Social History: Social History   Socioeconomic History   Marital status: Married    Spouse name: Not on file   Number of children: Not on file   Years of education: Not on file   Highest education level: Not on file  Occupational History   Not on file  Tobacco Use   Smoking status: Never   Smokeless tobacco: Never  Substance and Sexual Activity   Alcohol use: Yes   Drug use: No   Sexual activity: Yes  Other Topics Concern   Not on file  Social History Narrative   Not on file   Social Drivers of Health   Financial Resource Strain: Not on file  Food Insecurity: Not on file  Transportation Needs: Not on file  Physical Activity: Not on file  Stress: Not on file  Social Connections: Not on file   Family History: Family History  Problem Relation Age of Onset   Hypertension Mother    Allergies: Allergies  Allergen Reactions   Celestone Soluspan [Betamethasone Sod Phos & Acet] Anxiety   Medications: See med rec.  Review of Systems: No headache, visual changes, nausea, vomiting, diarrhea, constipation, dizziness, abdominal pain, skin rash, fevers, chills, night sweats, swollen lymph nodes, weight loss, chest pain, body aches, joint swelling, muscle aches, shortness of breath, mood changes, visual or auditory hallucinations.  Objective:    General: Well Developed, well nourished, and in no acute distress.  Neuro: Alert and oriented x3, extra-ocular muscles intact,  sensation grossly intact. Cranial nerves II through XII are intact, motor, sensory, and coordinative functions are all intact. HEENT: Normocephalic, atraumatic, pupils equal round reactive to light, neck supple, no masses, no lymphadenopathy, thyroid  nonpalpable. Oropharynx, nasopharynx, external ear canals are unremarkable. Skin: Warm and dry, no rashes noted.  Cardiac: Regular rate and rhythm, no murmurs rubs or gallops.  Respiratory: Clear to auscultation bilaterally. Not using accessory muscles, speaking in full sentences.  Abdominal: Soft, nontender, nondistended, positive bowel sounds, no masses, no organomegaly.  Musculoskeletal: Shoulder, elbow, wrist, hip, knee, ankle stable, and with full range of motion.  Impression and Recommendations:    The patient was counselled, risk factors were discussed, anticipatory guidance given.  Annual physical exam Annual physical as above, due for Shingrix , we discussed sleep hygiene, decreasing caffeinated beverage consumption to improve the sleep latency. Return to see me in 1 year for fasting annual physical.  Benign essential hypertension Well-controlled, no changes.  Excessive daytime sleepiness We discussed sleep hygiene changes including decreasing his diet Coke consumption for dinner, decreasing fluid consumption within 2 hours of bedtime, emptying bladder before bedtime, his main concern was increased sleep latency and voiding during the night.  Hyperlipidemia Has not been consistent with fish oil, triglycerides were a bit elevated, switching to fenofibrate  with a 71-month lipid panel recheck.  Elevated PSA PSA is elevated, free PSA somewhat low, because of this I like to recheck in 3 months and if not improved we  will consider urology referral.   ____________________________________________ Debby PARAS. Curtis, M.D., ABFM., CAQSM., AME. Primary Care and Sports Medicine [facility]  Adjunct Professor of  [facility] Medicine  [employer]  Restaurant manager, fast food

## 2023-02-03 NOTE — Assessment & Plan Note (Signed)
We discussed sleep hygiene changes including decreasing his diet Coke consumption for dinner, decreasing fluid consumption within 2 hours of bedtime, emptying bladder before bedtime, his main concern was increased sleep latency and voiding during the night.

## 2024-02-06 ENCOUNTER — Other Ambulatory Visit: Payer: Self-pay

## 2024-02-06 DIAGNOSIS — I10 Essential (primary) hypertension: Secondary | ICD-10-CM

## 2024-02-06 DIAGNOSIS — E785 Hyperlipidemia, unspecified: Secondary | ICD-10-CM

## 2024-02-06 MED ORDER — ATORVASTATIN CALCIUM 40 MG PO TABS
40.0000 mg | ORAL_TABLET | Freq: Every day | ORAL | 0 refills | Status: DC
Start: 2024-02-06 — End: 2024-03-06

## 2024-02-06 MED ORDER — AMLODIPINE BESYLATE 10 MG PO TABS
10.0000 mg | ORAL_TABLET | Freq: Every day | ORAL | 0 refills | Status: DC
Start: 2024-02-06 — End: 2024-03-06

## 2024-02-06 MED ORDER — LISINOPRIL-HYDROCHLOROTHIAZIDE 20-25 MG PO TABS
1.0000 | ORAL_TABLET | Freq: Every day | ORAL | 0 refills | Status: DC
Start: 2024-02-06 — End: 2024-03-06

## 2024-03-06 ENCOUNTER — Telehealth: Payer: Self-pay

## 2024-03-06 ENCOUNTER — Other Ambulatory Visit: Payer: Self-pay

## 2024-03-06 DIAGNOSIS — E785 Hyperlipidemia, unspecified: Secondary | ICD-10-CM

## 2024-03-06 DIAGNOSIS — I10 Essential (primary) hypertension: Secondary | ICD-10-CM

## 2024-03-06 MED ORDER — FENOFIBRATE 160 MG PO TABS: 160.0000 mg | ORAL_TABLET | Freq: Every day | ORAL | 0 refills | Status: AC

## 2024-03-06 MED ORDER — ATORVASTATIN CALCIUM 40 MG PO TABS: 40.0000 mg | ORAL_TABLET | Freq: Every day | ORAL | 0 refills | Status: DC

## 2024-03-06 MED ORDER — AMLODIPINE BESYLATE 10 MG PO TABS: 10.0000 mg | ORAL_TABLET | Freq: Every day | ORAL | 0 refills | Status: DC

## 2024-03-06 MED ORDER — LISINOPRIL-HYDROCHLOROTHIAZIDE 20-25 MG PO TABS
1.0000 | ORAL_TABLET | Freq: Every day | ORAL | 0 refills | Status: DC
Start: 2024-03-06 — End: 2024-04-05

## 2024-03-06 NOTE — Telephone Encounter (Signed)
 I have attempted to contact patient regarding medication refills and annual exam. Unfortunately, I was sent to VM and was unable to reach them. I have left a voice message requesting a call back to our office at their earliest convenience.

## 2024-03-23 ENCOUNTER — Other Ambulatory Visit: Payer: Self-pay | Admitting: Sports Medicine

## 2024-03-23 DIAGNOSIS — E785 Hyperlipidemia, unspecified: Secondary | ICD-10-CM

## 2024-03-23 DIAGNOSIS — E291 Testicular hypofunction: Secondary | ICD-10-CM

## 2024-03-23 DIAGNOSIS — R7303 Prediabetes: Secondary | ICD-10-CM | POA: Diagnosis not present

## 2024-03-24 ENCOUNTER — Ambulatory Visit: Payer: Self-pay | Admitting: Sports Medicine

## 2024-03-24 DIAGNOSIS — R972 Elevated prostate specific antigen [PSA]: Secondary | ICD-10-CM | POA: Insufficient documentation

## 2024-03-24 LAB — CBC
Hematocrit: 42.9 % (ref 37.5–51.0)
Hemoglobin: 14.3 g/dL (ref 13.0–17.7)
MCH: 30.3 pg (ref 26.6–33.0)
MCHC: 33.3 g/dL (ref 31.5–35.7)
MCV: 91 fL (ref 79–97)
Platelets: 301 x10E3/uL (ref 150–450)
RBC: 4.72 x10E6/uL (ref 4.14–5.80)
RDW: 12.3 % (ref 11.6–15.4)
WBC: 7.2 x10E3/uL (ref 3.4–10.8)

## 2024-03-24 LAB — COMPREHENSIVE METABOLIC PANEL WITH GFR
ALT: 33 IU/L (ref 0–44)
AST: 28 IU/L (ref 0–40)
Albumin: 4.8 g/dL (ref 3.8–4.9)
Alkaline Phosphatase: 45 IU/L (ref 44–121)
BUN/Creatinine Ratio: 13 (ref 9–20)
BUN: 15 mg/dL (ref 6–24)
Bilirubin Total: 0.9 mg/dL (ref 0.0–1.2)
CO2: 22 mmol/L (ref 20–29)
Calcium: 10.1 mg/dL (ref 8.7–10.2)
Chloride: 103 mmol/L (ref 96–106)
Creatinine, Ser: 1.16 mg/dL (ref 0.76–1.27)
Globulin, Total: 2.4 g/dL (ref 1.5–4.5)
Glucose: 93 mg/dL (ref 70–99)
Potassium: 4.2 mmol/L (ref 3.5–5.2)
Sodium: 143 mmol/L (ref 134–144)
Total Protein: 7.2 g/dL (ref 6.0–8.5)
eGFR: 75 mL/min/1.73 (ref >59)

## 2024-03-24 LAB — PSA, TOTAL AND FREE
PSA, Free Pct: 7 %
PSA, Free: 0.43 ng/mL
Prostate Specific Ag, Serum: 6.1 ng/mL — ABNORMAL HIGH (ref 0.0–4.0)

## 2024-03-24 LAB — LIPID PANEL
Chol/HDL Ratio: 5.1 ratio — ABNORMAL HIGH (ref 0.0–5.0)
Cholesterol, Total: 138 mg/dL (ref 100–199)
HDL: 27 mg/dL — ABNORMAL LOW (ref >39)
LDL Chol Calc (NIH): 80 mg/dL (ref 0–99)
Triglycerides: 177 mg/dL — ABNORMAL HIGH (ref 0–149)
VLDL Cholesterol Cal: 31 mg/dL (ref 5–40)

## 2024-03-24 LAB — TSH: TSH: 2.21 u[IU]/mL (ref 0.450–4.500)

## 2024-03-24 LAB — HEMOGLOBIN A1C
Est. average glucose Bld gHb Est-mCnc: 123 mg/dL
Hgb A1c MFr Bld: 5.9 % — ABNORMAL HIGH (ref 4.8–5.6)

## 2024-03-24 NOTE — Assessment & Plan Note (Signed)
 PSA is elevated, free PSA somewhat low, because of this I like to recheck in 3 months and if not improved we will consider urology referral.

## 2024-03-24 NOTE — Addendum Note (Signed)
 Addended by: CURTIS DEBBY PARAS on: 03/24/2024 03:29 PM   Modules accepted: Orders

## 2024-03-26 ENCOUNTER — Ambulatory Visit: Payer: Self-pay | Admitting: *Deleted

## 2024-03-26 NOTE — Telephone Encounter (Signed)
 Message from [place] [facility] sent at 03/26/2024  2:11 PM EDT  Summary: rash   Rash possibly shingle          Call History  Contact Date/Time Type Contact Phone/Fax By  03/26/2024 02:09 PM EDT Phone (Incoming) Joesph Anes (Self)  [place], [place] LABOR

## 2024-03-26 NOTE — Telephone Encounter (Signed)
 E2C2 does not triage Dr. Nathalie pts.   Forwarded to [facility] per policy.

## 2024-03-27 ENCOUNTER — Ambulatory Visit (INDEPENDENT_AMBULATORY_CARE_PROVIDER_SITE_OTHER): Admitting: Sports Medicine

## 2024-03-27 VITALS — BP 118/80 | HR 72 | Ht 71.0 in | Wt 264.0 lb

## 2024-03-27 DIAGNOSIS — Z0000 Encounter for general adult medical examination without abnormal findings: Secondary | ICD-10-CM

## 2024-03-27 DIAGNOSIS — Z1211 Encounter for screening for malignant neoplasm of colon: Secondary | ICD-10-CM

## 2024-03-27 DIAGNOSIS — B029 Zoster without complications: Secondary | ICD-10-CM | POA: Diagnosis not present

## 2024-03-27 MED ORDER — VALACYCLOVIR HCL 1 G PO TABS
1000.0000 mg | ORAL_TABLET | Freq: Two times a day (BID) | ORAL | 2 refills | Status: AC
Start: 2024-03-27 — End: ?

## 2024-03-27 NOTE — Progress Notes (Signed)
    Procedures performed today:    None.  Independent interpretation of notes and tests performed by another provider:   None.  Brief History, Exam, Impression, and Recommendations:    Shingles right C8 distribution Very pleasant 53 year old male, he has had his full Shingrix  vaccination series, approximately 4 days ago he developed malaise. He then developed a rash today localized on posterior right elbow, slightly painful. No identifiable exposures. On examination he does have what appears to be grouped papular rash, not quite vesicular.  It does appear to be in the C8 distribution from the medial right distal forearm worst at the medial elbow and to some degree medial upper arm. I do think this is shingles, we will add Valtrex  twice a day for 7 days. He does have his daughters gender reveal to go to, I have advised against this for now.    ____________________________________________ Debby PARAS. Curtis, M.D., ABFM., CAQSM., AME. Primary Care and Sports Medicine [facility]  Adjunct Professor of [facility] Medicine  [employer]  Restaurant manager, fast food

## 2024-03-27 NOTE — Telephone Encounter (Signed)
 Patient scheduled for 2pm today with DR. Curtis

## 2024-03-27 NOTE — Telephone Encounter (Signed)
Attempted call to patient. Mail box full. Could not leave a voice mail message.  

## 2024-03-27 NOTE — Assessment & Plan Note (Signed)
 Very pleasant 53 year old male, he has had his full Shingrix  vaccination series, approximately 4 days ago he developed malaise. He then developed a rash today localized on posterior right elbow, slightly painful. No identifiable exposures. On examination he does have what appears to be grouped papular rash, not quite vesicular.  It does appear to be in the C8 distribution from the medial right distal forearm worst at the medial elbow and to some degree medial upper arm. I do think this is shingles, we will add Valtrex  twice a day for 7 days. He does have his daughters gender reveal to go to, I have advised against this for now.

## 2024-04-05 ENCOUNTER — Other Ambulatory Visit: Payer: Self-pay

## 2024-04-05 DIAGNOSIS — E785 Hyperlipidemia, unspecified: Secondary | ICD-10-CM

## 2024-04-05 DIAGNOSIS — I10 Essential (primary) hypertension: Secondary | ICD-10-CM

## 2024-04-05 MED ORDER — LISINOPRIL-HYDROCHLOROTHIAZIDE 20-25 MG PO TABS
1.0000 | ORAL_TABLET | Freq: Every day | ORAL | 1 refills | Status: AC
Start: 2024-04-05 — End: ?

## 2024-04-05 MED ORDER — ATORVASTATIN CALCIUM 40 MG PO TABS: 40.0000 mg | ORAL_TABLET | Freq: Every day | ORAL | 1 refills | Status: AC

## 2024-04-05 MED ORDER — AMLODIPINE BESYLATE 10 MG PO TABS
10.0000 mg | ORAL_TABLET | Freq: Every day | ORAL | 1 refills | Status: AC
Start: 2024-04-05 — End: ?

## 2024-04-11 ENCOUNTER — Ambulatory Visit (INDEPENDENT_AMBULATORY_CARE_PROVIDER_SITE_OTHER): Admitting: Sports Medicine

## 2024-04-11 VITALS — BP 109/73 | HR 76 | Ht 71.0 in | Wt 262.0 lb

## 2024-04-11 DIAGNOSIS — N529 Male erectile dysfunction, unspecified: Secondary | ICD-10-CM

## 2024-04-11 DIAGNOSIS — Z0000 Encounter for general adult medical examination without abnormal findings: Secondary | ICD-10-CM | POA: Diagnosis not present

## 2024-04-11 DIAGNOSIS — R972 Elevated prostate specific antigen [PSA]: Secondary | ICD-10-CM

## 2024-04-11 NOTE — Progress Notes (Addendum)
 Subjective:    CC: Annual Physical Exam  HPI:  This patient is here for their annual physical  I reviewed the past medical history, family history, social history, surgical history, and allergies today and no changes were needed.  Please see the problem list section below in epic for further details.  Past Medical History: Past Medical History:  Diagnosis Date   Hypertension    untreated   Past Surgical History: Past Surgical History:  Procedure Laterality Date   HERNIA REPAIR     VASECTOMY     Social History: Social History   Socioeconomic History   Marital status: Married    Spouse name: Not on file   Number of children: Not on file   Years of education: Not on file   Highest education level: Not on file  Occupational History   Not on file  Tobacco Use   Smoking status: Never   Smokeless tobacco: Never  Substance and Sexual Activity   Alcohol use: Yes   Drug use: No   Sexual activity: Yes  Other Topics Concern   Not on file  Social History Narrative   Not on file   Social Drivers of Health   Financial Resource Strain: Not on file  Food Insecurity: Not on file  Transportation Needs: Not on file  Physical Activity: Not on file  Stress: Not on file  Social Connections: Not on file   Family History: Family History  Problem Relation Age of Onset   Hypertension Mother    Allergies: Allergies  Allergen Reactions   Celestone Soluspan [Betamethasone Sod Phos & Acet] Anxiety   Medications: See med rec.  Review of Systems: No headache, visual changes, nausea, vomiting, diarrhea, constipation, dizziness, abdominal pain, skin rash, fevers, chills, night sweats, swollen lymph nodes, weight loss, chest pain, body aches, joint swelling, muscle aches, shortness of breath, mood changes, visual or auditory hallucinations.  Objective:    General: Well Developed, well nourished, and in no acute distress.  Neuro: Alert and oriented x3, extra-ocular muscles intact,  sensation grossly intact. Cranial nerves II through XII are intact, motor, sensory, and coordinative functions are all intact. HEENT: Normocephalic, atraumatic, pupils equal round reactive to light, neck supple, no masses, no lymphadenopathy, thyroid  nonpalpable. Oropharynx, nasopharynx, external ear canals are unremarkable. Skin: Warm and dry, no rashes noted.  Cardiac: Regular rate and rhythm, no murmurs rubs or gallops.  Respiratory: Clear to auscultation bilaterally. Not using accessory muscles, speaking in full sentences.  Abdominal: Soft, nontender, nondistended, positive bowel sounds, no masses, no organomegaly.  Musculoskeletal: Shoulder, elbow, wrist, hip, knee, ankle stable, and with full range of motion.  Impression and Recommendations:    The patient was counselled, risk factors were discussed, anticipatory guidance given.  Annual physical exam Annual physical as above, up-to-date on screenings. Still does however need to do his Cologuard.  Elevated PSA Elevated PSA, free PSA was low, we will be rechecking today. If not improved, or worsened we will consider urology referral. I explained the pathophysiology of prostate cancer, the likelihood as we age, I also explained the differential including BPH and prostatitis.  Update: PSA is higher, free PSA low, I will go ahead and do the referral to urology and order a prostate MRI.  Erectile dysfunction Noting some difficulty with duration of erections. We discussed the physiology of erections, we discussed the potential treatments including increasing penile inflow with phosphodiesterase inhibitors such as Cialis, we also discussed decreasing venous outflow with various techniques in the bedroom as  well as penile compressive rings. He will try the latter. Return as needed for this.   ____________________________________________ Debby PARAS. Curtis, M.D., ABFM., CAQSM., AME. Primary Care and Sports Medicine [place]  [facility]  Adjunct Professor of [employer] [facility] [employer] [facility] Medicine  [employer]  Restaurant manager, fast food

## 2024-04-11 NOTE — Assessment & Plan Note (Signed)
 Annual physical as above, up-to-date on screenings. Still does however need to do his Cologuard.

## 2024-04-11 NOTE — Assessment & Plan Note (Addendum)
 Elevated PSA, free PSA was low, we will be rechecking today. If not improved, or worsened we will consider urology referral. I explained the pathophysiology of prostate cancer, the likelihood as we age, I also explained the differential including BPH and prostatitis.  Update: PSA is higher, free PSA low, I will go ahead and do the referral to urology and order a prostate MRI.

## 2024-04-11 NOTE — Assessment & Plan Note (Signed)
 Noting some difficulty with duration of erections. We discussed the physiology of erections, we discussed the potential treatments including increasing penile inflow with phosphodiesterase inhibitors such as Cialis, we also discussed decreasing venous outflow with various techniques in the bedroom as well as penile compressive rings. He will try the latter. Return as needed for this.

## 2024-04-12 ENCOUNTER — Ambulatory Visit: Payer: Self-pay | Admitting: Sports Medicine

## 2024-04-12 LAB — PSA, TOTAL AND FREE
PSA, Free Pct: 5.3 %
PSA, Free: 0.4 ng/mL
Prostate Specific Ag, Serum: 7.6 ng/mL — ABNORMAL HIGH (ref 0.0–4.0)

## 2024-04-12 NOTE — Addendum Note (Signed)
 Addended by: CURTIS DEBBY PARAS on: 04/12/2024 10:15 AM   Modules accepted: Orders

## 2024-04-17 DIAGNOSIS — Z1211 Encounter for screening for malignant neoplasm of colon: Secondary | ICD-10-CM | POA: Diagnosis not present

## 2024-04-20 ENCOUNTER — Ambulatory Visit (HOSPITAL_COMMUNITY)
Admission: RE | Admit: 2024-04-20 | Discharge: 2024-04-20 | Disposition: A | Discharge status: Home / Self Care | Source: Ambulatory Visit | Attending: Family Medicine | Admitting: Family Medicine

## 2024-04-20 ENCOUNTER — Other Ambulatory Visit: Payer: Self-pay | Admitting: Sports Medicine

## 2024-04-20 ENCOUNTER — Ambulatory Visit (HOSPITAL_COMMUNITY)
Admission: RE | Admit: 2024-04-20 | Discharge: 2024-04-20 | Disposition: A | Discharge status: Home / Self Care | Source: Ambulatory Visit | Attending: Sports Medicine | Admitting: Sports Medicine

## 2024-04-20 DIAGNOSIS — R972 Elevated prostate specific antigen [PSA]: Secondary | ICD-10-CM | POA: Diagnosis not present

## 2024-04-20 DIAGNOSIS — Z1389 Encounter for screening for other disorder: Secondary | ICD-10-CM | POA: Diagnosis not present

## 2024-04-20 DIAGNOSIS — N4289 Other specified disorders of prostate: Secondary | ICD-10-CM | POA: Diagnosis not present

## 2024-04-20 MED ORDER — GADOBUTROL 1 MMOL/ML IV SOLN
10.0000 mL | Freq: Once | INTRAVENOUS | Status: AC | PRN
  Administered 2024-04-20: 10 mL via INTRAVENOUS

## 2024-04-24 ENCOUNTER — Encounter: Payer: Self-pay | Admitting: Sports Medicine

## 2024-04-24 NOTE — Progress Notes (Signed)
    Chief Complaint: No chief complaint on file.   History of Present Illness: This 53 yo male is referred by Dr Pamella for E/M of elevating PSA trend. PSA data:  10/2018--0.9 12/2022--3.0 03/2024--6.1 (7% free).  MRI prostate has been performed. Radiologic eval not ready yet.  He denies significant lower urinary tract symptoms.  He urologic history is significant for varicocelectomy, hernia repair and vasectomy by Dr. Terence here in [facility].  He does have a brother who recently had radical prostatectomy.  His brother is 2 years older.   Past Medical History:  Past Medical History:  Diagnosis Date   Hypertension    untreated    Past Surgical History:  Past Surgical History:  Procedure Laterality Date   HERNIA REPAIR     VASECTOMY      Allergies:  Allergies  Allergen Reactions   Celestone Soluspan [Betamethasone Sod Phos & Acet] Anxiety    Family History:  Family History  Problem Relation Age of Onset   Hypertension Mother     Social History:  Social History   Tobacco Use   Smoking status: Never   Smokeless tobacco: Never  Substance Use Topics   Alcohol use: Yes   Drug use: No    Review of symptoms:  Constitutional:  Negative for unexplained weight loss, night sweats, fever, chills ENT:  Negative for nose bleeds, sinus pain, painful swallowing CV:  Negative for chest pain, shortness of breath, exercise intolerance, palpitations, loss of consciousness Resp:  Negative for cough, wheezing, shortness of breath GI:  Negative for nausea, vomiting, diarrhea, bloody stools GU:  Positives noted in HPI; otherwise negative for gross hematuria, dysuria, urinary incontinence Neuro:  Negative for seizures, poor balance, limb weakness, slurred speech Psych:  Negative for lack of energy, depression, anxiety Endocrine:  Negative for polydipsia, polyuria, symptoms of hypoglycemia (dizziness, hunger, sweating) Hematologic:  Negative for anemia, purpura, petechia,  prolonged or excessive bleeding, use of anticoagulants  Allergic:  Negative for difficulty breathing or choking as a result of exposure to anything; no shellfish allergy; no allergic response (rash/itch) to materials, foods  Physical exam: There were no vitals taken for this visit. GENERAL APPEARANCE:  Well appearing, well developed, well nourished, NAD HEENT: Atraumatic, Normocephalic. NECK: Normal appearance LUNGS: Normal inspiratory and expiratory excursion HEART: Regular Rate ABDOMEN: Mildly obese.  No inguinal hernias GU: Phallus normal, no lesions. Scrotal skin normal. Testicles/epididymal structures normal. Meatus normal. Normal anal sphincter tone, prostate 30 mL, symmetric, non nodular, non tender. EXTREMITIES: Moves all extremities well.  Without clubbing, cyanosis, or edema. NEUROLOGIC:  Alert and oriented x 3, normal gait, CN II-XII grossly intact.  MENTAL STATUS:  Appropriate. SKIN:  Warm, dry and intact.    Results  I have reviewed referring/prior physicians notes  I have reviewed urinalysis--clear  I have reviewed PSA results    Assessment: Elevated PSA in a 53 year old with family history of prostate cancer.  Benign exam.  MRI pending   Plan: I discussed with Sabatino the fact that the MRI has not been read yet.  I am not able to interpret MRI prostate images.  I will call him once I get the result.  We will talk about further management after that.  I did discuss the fact that the regions of interest are noted, we will send him to [facility] for fusion biopsy.

## 2024-04-25 ENCOUNTER — Ambulatory Visit (INDEPENDENT_AMBULATORY_CARE_PROVIDER_SITE_OTHER): Admitting: Urology

## 2024-04-25 VITALS — BP 127/85 | HR 76 | Ht 70.0 in | Wt 260.0 lb

## 2024-04-25 DIAGNOSIS — N5201 Erectile dysfunction due to arterial insufficiency: Secondary | ICD-10-CM | POA: Diagnosis not present

## 2024-04-25 DIAGNOSIS — Z8042 Family history of malignant neoplasm of prostate: Secondary | ICD-10-CM

## 2024-04-25 DIAGNOSIS — R972 Elevated prostate specific antigen [PSA]: Secondary | ICD-10-CM

## 2024-04-25 LAB — URINALYSIS, ROUTINE W REFLEX MICROSCOPIC
Bilirubin, UA: NEGATIVE
Glucose, UA: NEGATIVE
Ketones, UA: NEGATIVE
Leukocytes,UA: NEGATIVE
Nitrite, UA: NEGATIVE
RBC, UA: NEGATIVE
Specific Gravity, UA: 1.015 (ref 1.005–1.030)
Urobilinogen, Ur: 0.2 mg/dL (ref 0.2–1.0)
pH, UA: 7 (ref 5.0–7.5)

## 2024-04-25 LAB — MICROSCOPIC EXAMINATION
Casts: POSITIVE /LPF — AB
Mucus, UA: POSITIVE — AB

## 2024-04-25 LAB — BLADDER SCAN AMB NON-IMAGING: Scan Result: 20

## 2024-04-26 LAB — COLOGUARD: COLOGUARD: NEGATIVE

## 2024-05-02 ENCOUNTER — Telehealth: Payer: Self-pay | Admitting: Urology

## 2024-05-02 NOTE — Telephone Encounter (Signed)
 Pt called and stated Dr Lanice called him and lvm. Please advise.

## 2024-05-04 ENCOUNTER — Ambulatory Visit: Payer: Self-pay | Admitting: Urology

## 2024-05-07 ENCOUNTER — Other Ambulatory Visit: Payer: Self-pay | Admitting: Urology

## 2024-05-07 DIAGNOSIS — R972 Elevated prostate specific antigen [PSA]: Secondary | ICD-10-CM

## 2024-05-07 NOTE — Telephone Encounter (Signed)
 Pt called and lvm to set up fusion biopsy. Please advise 05/07/24

## 2024-05-11 ENCOUNTER — Telehealth: Payer: Self-pay | Admitting: Urology

## 2024-05-11 NOTE — Telephone Encounter (Signed)
 Pt called and lvm about a fusion biopsy wanting to schedule. Please advise.

## 2024-05-14 NOTE — Telephone Encounter (Signed)
 Referral has been placed.

## 2024-05-30 DIAGNOSIS — R972 Elevated prostate specific antigen [PSA]: Secondary | ICD-10-CM | POA: Diagnosis not present

## 2024-05-30 DIAGNOSIS — C61 Malignant neoplasm of prostate: Secondary | ICD-10-CM | POA: Diagnosis not present

## 2024-06-06 ENCOUNTER — Encounter: Payer: Self-pay | Admitting: Urology

## 2024-06-20 ENCOUNTER — Encounter: Payer: Self-pay | Admitting: Urology

## 2024-06-20 ENCOUNTER — Other Ambulatory Visit: Payer: Self-pay | Admitting: Urology

## 2024-06-20 DIAGNOSIS — C61 Malignant neoplasm of prostate: Secondary | ICD-10-CM

## 2024-07-06 ENCOUNTER — Encounter (HOSPITAL_COMMUNITY)

## 2024-07-16 ENCOUNTER — Encounter (HOSPITAL_COMMUNITY)
Admission: RE | Admit: 2024-07-16 | Discharge: 2024-07-16 | Disposition: A | Discharge status: Home / Self Care | Source: Ambulatory Visit | Attending: Urology | Admitting: Urology

## 2024-07-16 DIAGNOSIS — C61 Malignant neoplasm of prostate: Secondary | ICD-10-CM | POA: Diagnosis not present

## 2024-07-16 DIAGNOSIS — Z0389 Encounter for observation for other suspected diseases and conditions ruled out: Secondary | ICD-10-CM | POA: Diagnosis not present

## 2024-07-16 MED ORDER — FLOTUFOLASTAT F 18 GALLIUM 296-5846 MBQ/ML IV SOLN
7.6500 | Freq: Once | INTRAVENOUS | Status: AC
  Administered 2024-07-16: 7.65 via INTRAVENOUS

## 2024-07-18 DIAGNOSIS — C61 Malignant neoplasm of prostate: Secondary | ICD-10-CM | POA: Diagnosis not present

## 2024-07-24 ENCOUNTER — Ambulatory Visit: Payer: Self-pay | Admitting: Urology

## 2024-08-10 ENCOUNTER — Other Ambulatory Visit: Payer: Self-pay | Admitting: Urology

## 2024-10-03 ENCOUNTER — Encounter (HOSPITAL_COMMUNITY): Admission: RE | Admit: 2024-10-03

## 2024-10-12 ENCOUNTER — Ambulatory Visit: Admitting: Sports Medicine

## 2024-10-12 ENCOUNTER — Ambulatory Visit: Admitting: Urgent Care

## 2024-10-12 ENCOUNTER — Ambulatory Visit (HOSPITAL_COMMUNITY): Admit: 2024-10-12 | Admitting: Urology

## 2024-10-12 SURGERY — PROSTATECTOMY, RADICAL, ROBOT-ASSISTED, LAPAROSCOPIC
Anesthesia: General
